# Patient Record
Sex: Male | Born: 1945 | Race: White | Hispanic: No | Marital: Single | Smoking: Current every day smoker
Health system: Southern US, Community
[De-identification: ages and names within clinical notes are randomized; demographics above are authoritative.]

## PROBLEM LIST (undated history)

## (undated) DIAGNOSIS — T8859XA Other complications of anesthesia, initial encounter: Secondary | ICD-10-CM

## (undated) DIAGNOSIS — S060XAA Concussion with loss of consciousness status unknown, initial encounter: Secondary | ICD-10-CM

## (undated) DIAGNOSIS — F32A Depression, unspecified: Secondary | ICD-10-CM

## (undated) DIAGNOSIS — G709 Myoneural disorder, unspecified: Secondary | ICD-10-CM

## (undated) DIAGNOSIS — S060X9A Concussion with loss of consciousness of unspecified duration, initial encounter: Secondary | ICD-10-CM

## (undated) DIAGNOSIS — M1990 Unspecified osteoarthritis, unspecified site: Secondary | ICD-10-CM

## (undated) DIAGNOSIS — F4310 Post-traumatic stress disorder, unspecified: Secondary | ICD-10-CM

## (undated) DIAGNOSIS — I4892 Unspecified atrial flutter: Secondary | ICD-10-CM

## (undated) DIAGNOSIS — E119 Type 2 diabetes mellitus without complications: Secondary | ICD-10-CM

## (undated) DIAGNOSIS — T4145XA Adverse effect of unspecified anesthetic, initial encounter: Secondary | ICD-10-CM

## (undated) DIAGNOSIS — I739 Peripheral vascular disease, unspecified: Secondary | ICD-10-CM

## (undated) DIAGNOSIS — K219 Gastro-esophageal reflux disease without esophagitis: Secondary | ICD-10-CM

## (undated) DIAGNOSIS — I10 Essential (primary) hypertension: Secondary | ICD-10-CM

## (undated) DIAGNOSIS — E785 Hyperlipidemia, unspecified: Secondary | ICD-10-CM

## (undated) DIAGNOSIS — F329 Major depressive disorder, single episode, unspecified: Secondary | ICD-10-CM

## (undated) HISTORY — DX: Unspecified atrial flutter: I48.92

## (undated) HISTORY — DX: Hyperlipidemia, unspecified: E78.5

## (undated) HISTORY — PX: APPENDECTOMY: SHX54

## (undated) HISTORY — PX: EYE SURGERY: SHX253

---

## 1999-03-08 HISTORY — PX: REPLACEMENT TOTAL KNEE: SUR1224

## 2002-04-06 ENCOUNTER — Inpatient Hospital Stay (HOSPITAL_COMMUNITY): Admission: EM | Admit: 2002-04-06 | Discharge: 2002-04-08 | Payer: Self-pay | Admitting: Psychiatry

## 2009-04-21 ENCOUNTER — Ambulatory Visit: Payer: Self-pay | Admitting: Vascular Surgery

## 2010-07-20 NOTE — H&P (Signed)
HISTORY AND PHYSICAL EXAMINATION   April 21, 2009   Re:  Brian Pratt, Brian Pratt                   DOB:  Oct 11, 1945   Patient is a very pleasant 65 year old gentleman with hypertension and  type 2 diabetes mellitus with dyslipidemia.  He is referred to our  office for evaluation of peripheral vascular disease.  At this time, he  states his peripheral vascular disease consists of cramping pain in his  left calf with walking.  He has been experiencing this for a little over  a year.  In 09/01/2008 while in [place], he underwent a CTA which at  that time demonstrated a high-grade stenosis greater than 70% involving  the origin of the left common iliac artery and proximal vessel.  There  was also significant disease involving the anterior tibial and  tibioperoneal trunks with a well-defined single vessel runoff on the  left posterior tibial artery.  On the right, calcified plaque was seen  throughout the right SFA and popliteal artery with 2-vessel runoff.  There was no significant inflow stenosis.  Patient states that his  symptoms have worsened over the past 6 to 9 months.  He is not having  pain at rest; however, he does have discomfort upon minimal-to-moderate  exertion, which is relieved with rest.   PAST MEDICAL HISTORY:  As above, also including COPD and post-traumatic  stress disorder.   PREVIOUS SURGERIES:  Consist of colonoscopy and endoscopy, which were  negative per patient.  Also removal of left Baker's cyst and left knee  replacement.   ALLERGIES:  None.   CURRENT MEDICATIONS:  Hematinic Plus 1 daily p.o.,  lisinopril/hydrochlorothiazide 20/25 one p.o. daily, omeprazole 20 mg 2  p.o. daily, pravastatin 80 mg p.o. daily, men's multivitamin 1 p.o.  daily, vitamin C 1000 mg with bioflavonoids and rose hips p.o. daily,  Januvia 100 mg p.o. daily, metformin 850 mg p.o. t.i.d., Levitra 20 mg  p.o. p.r.n., glipizide 10 mg p.o. b.i.d., omega 3 fish oil 1000 mg  p.o.  daily, Libido-Max p.o. b.i.d., alprazolam 1 mg p.o. q.h.s., OxyContin 40  mg p.o. b.i.d., Combivent inhalers 2 puffs inhalations q.i.d.   FAMILY HISTORY:  He was not aware of any congestive heart failure or  vascular disease in his family.   SOCIAL HISTORY:  He is single with a single course.  He is retired.  He  smokes approximately 8 to 10 cigarettes per day.  He has a remote  history of tobacco use.   REVIEW OF SYSTEMS:  Significant for shortness of breath when lying flat  and with exertion due to his COPD.  He did have GERD significant for  blood in stool.  He does have pain in the legs with walking.  He also  has arthritis, joint and muscle pain.  He is status post-traumatic  stress disorder.  He has had some changes in his eyesight.   PHYSICAL FINDINGS:  A very pleasant, well-nourished man who appeared his  stated age, who was not in distress.  Heart rate was 102, blood pressure  146/75, O2 saturation was 98%.  HEENT: PERRLA.  EOMI.  Normal  conjunctiva.  There was no jugular venous distention or carotid bruits.  Lungs: Clear to auscultation.  Cardiac exam revealed a regular rate and  rhythm.  The abdomen was soft, nontender.  Normal bowel sounds were  present.  There were no masses.  Musculoskeletal exam demonstrated no  major deformities.  There were no focal weaknesses or paresthesias.  The  skin demonstrated no ulcers or rashes.  Vascular exam revealed  peripheral pulses in radials, which were normal.  The right femoral was  normal.  The left femoral was markedly diminished.  The right PT and DP  were palpable.  The left PT and DP were not palpable.   LABORATORY RESULTS:  From [place], he had a carotid ultrasound which  demonstrated no evidence of hemodynamically significant stenosis in  either the common or internal carotid arteries on either side and normal  antegrade vertebral flow bilaterally.  He also had the CTA performed,  which was previously mentioned.  His  reports from [place] mention of a  ABI and Dopplers.  ABI on the right was normal at 1.04.  On the left was  diminished to 0.79.  His ABIs today, 04/21/2009, demonstrated a  continued normal right lower extremity with ABI of 1.01.  Left was 0.61.   At this time, I asked Dr. Hart Pratt for his assistance in evaluating this  patient.  Dr. Hart Pratt entered the room.  Examined the patient's lower  extremities, which included palpation of the arteries of his lower  extremities.  He found his left femoral artery to be markedly  diminished.  We felt this to represent a progression of the disease of  his native vessels.  Dr. Hart Pratt recommended an aortogram with runoff  with possible stent/angioplasty of the left iliac artery to be performed  by Dr. Myra Gianotti on 05/05/2009.  He was informed of the risks and benefits  of the procedure and after careful consideration, elected to proceed.  Arrangements were made for his procedure.   Wilmon Arms, PA   Brian Pratt, M.D.  Electronically Signed   KEL/MEDQ  D:  04/21/2009  T:  04/21/2009  Job:  [id number]
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
## 2010-09-07 DIAGNOSIS — I119 Hypertensive heart disease without heart failure: Secondary | ICD-10-CM | POA: Insufficient documentation

## 2010-09-07 DIAGNOSIS — E119 Type 2 diabetes mellitus without complications: Secondary | ICD-10-CM | POA: Insufficient documentation

## 2011-08-04 DIAGNOSIS — E7800 Pure hypercholesterolemia, unspecified: Secondary | ICD-10-CM | POA: Diagnosis not present

## 2011-08-04 DIAGNOSIS — I10 Essential (primary) hypertension: Secondary | ICD-10-CM | POA: Diagnosis not present

## 2011-08-04 DIAGNOSIS — E119 Type 2 diabetes mellitus without complications: Secondary | ICD-10-CM | POA: Diagnosis not present

## 2011-08-16 DIAGNOSIS — Z79899 Other long term (current) drug therapy: Secondary | ICD-10-CM | POA: Diagnosis not present

## 2011-08-16 DIAGNOSIS — E119 Type 2 diabetes mellitus without complications: Secondary | ICD-10-CM | POA: Diagnosis not present

## 2011-08-16 DIAGNOSIS — Z125 Encounter for screening for malignant neoplasm of prostate: Secondary | ICD-10-CM | POA: Diagnosis not present

## 2011-08-16 DIAGNOSIS — D539 Nutritional anemia, unspecified: Secondary | ICD-10-CM | POA: Diagnosis not present

## 2011-10-26 DIAGNOSIS — I10 Essential (primary) hypertension: Secondary | ICD-10-CM | POA: Diagnosis not present

## 2012-06-25 DIAGNOSIS — E785 Hyperlipidemia, unspecified: Secondary | ICD-10-CM | POA: Diagnosis not present

## 2012-06-25 DIAGNOSIS — R9431 Abnormal electrocardiogram [ECG] [EKG]: Secondary | ICD-10-CM | POA: Diagnosis not present

## 2012-06-25 DIAGNOSIS — I10 Essential (primary) hypertension: Secondary | ICD-10-CM | POA: Diagnosis not present

## 2012-07-06 DIAGNOSIS — F411 Generalized anxiety disorder: Secondary | ICD-10-CM | POA: Diagnosis not present

## 2012-07-06 DIAGNOSIS — G8929 Other chronic pain: Secondary | ICD-10-CM | POA: Diagnosis not present

## 2012-07-06 DIAGNOSIS — N529 Male erectile dysfunction, unspecified: Secondary | ICD-10-CM | POA: Diagnosis not present

## 2012-08-02 DIAGNOSIS — E7800 Pure hypercholesterolemia, unspecified: Secondary | ICD-10-CM | POA: Diagnosis not present

## 2012-08-02 DIAGNOSIS — Z006 Encounter for examination for normal comparison and control in clinical research program: Secondary | ICD-10-CM | POA: Diagnosis not present

## 2012-08-02 DIAGNOSIS — D649 Anemia, unspecified: Secondary | ICD-10-CM | POA: Diagnosis not present

## 2012-08-02 DIAGNOSIS — I10 Essential (primary) hypertension: Secondary | ICD-10-CM | POA: Diagnosis not present

## 2012-08-02 DIAGNOSIS — D539 Nutritional anemia, unspecified: Secondary | ICD-10-CM | POA: Diagnosis not present

## 2012-08-02 DIAGNOSIS — F411 Generalized anxiety disorder: Secondary | ICD-10-CM | POA: Diagnosis not present

## 2012-08-23 DIAGNOSIS — R9431 Abnormal electrocardiogram [ECG] [EKG]: Secondary | ICD-10-CM | POA: Diagnosis not present

## 2012-09-11 DIAGNOSIS — I119 Hypertensive heart disease without heart failure: Secondary | ICD-10-CM | POA: Diagnosis not present

## 2012-12-24 DIAGNOSIS — Z0000 Encounter for general adult medical examination without abnormal findings: Secondary | ICD-10-CM | POA: Diagnosis not present

## 2013-04-08 DIAGNOSIS — IMO0001 Reserved for inherently not codable concepts without codable children: Secondary | ICD-10-CM | POA: Diagnosis not present

## 2013-04-15 DIAGNOSIS — E871 Hypo-osmolality and hyponatremia: Secondary | ICD-10-CM | POA: Diagnosis not present

## 2013-04-16 DIAGNOSIS — IMO0001 Reserved for inherently not codable concepts without codable children: Secondary | ICD-10-CM | POA: Diagnosis not present

## 2013-04-29 DIAGNOSIS — Z79899 Other long term (current) drug therapy: Secondary | ICD-10-CM | POA: Diagnosis not present

## 2013-05-23 DIAGNOSIS — IMO0001 Reserved for inherently not codable concepts without codable children: Secondary | ICD-10-CM | POA: Diagnosis not present

## 2013-12-06 DIAGNOSIS — E1165 Type 2 diabetes mellitus with hyperglycemia: Secondary | ICD-10-CM | POA: Diagnosis not present

## 2014-02-20 DIAGNOSIS — E1165 Type 2 diabetes mellitus with hyperglycemia: Secondary | ICD-10-CM | POA: Diagnosis not present

## 2014-02-20 DIAGNOSIS — D509 Iron deficiency anemia, unspecified: Secondary | ICD-10-CM | POA: Diagnosis not present

## 2014-02-20 DIAGNOSIS — E780 Pure hypercholesterolemia: Secondary | ICD-10-CM | POA: Diagnosis not present

## 2014-02-20 DIAGNOSIS — Z79899 Other long term (current) drug therapy: Secondary | ICD-10-CM | POA: Diagnosis not present

## 2014-02-26 DIAGNOSIS — R5381 Other malaise: Secondary | ICD-10-CM | POA: Diagnosis not present

## 2014-03-05 DIAGNOSIS — R5381 Other malaise: Secondary | ICD-10-CM | POA: Diagnosis not present

## 2014-03-12 DIAGNOSIS — D510 Vitamin B12 deficiency anemia due to intrinsic factor deficiency: Secondary | ICD-10-CM | POA: Diagnosis not present

## 2014-03-19 DIAGNOSIS — D510 Vitamin B12 deficiency anemia due to intrinsic factor deficiency: Secondary | ICD-10-CM | POA: Diagnosis not present

## 2014-04-01 DIAGNOSIS — E1165 Type 2 diabetes mellitus with hyperglycemia: Secondary | ICD-10-CM | POA: Diagnosis not present

## 2014-04-01 DIAGNOSIS — D510 Vitamin B12 deficiency anemia due to intrinsic factor deficiency: Secondary | ICD-10-CM | POA: Diagnosis not present

## 2014-04-22 DIAGNOSIS — D510 Vitamin B12 deficiency anemia due to intrinsic factor deficiency: Secondary | ICD-10-CM | POA: Diagnosis not present

## 2014-04-22 DIAGNOSIS — E1165 Type 2 diabetes mellitus with hyperglycemia: Secondary | ICD-10-CM | POA: Diagnosis not present

## 2014-04-29 DIAGNOSIS — E1165 Type 2 diabetes mellitus with hyperglycemia: Secondary | ICD-10-CM | POA: Diagnosis not present

## 2014-04-29 DIAGNOSIS — Z0000 Encounter for general adult medical examination without abnormal findings: Secondary | ICD-10-CM | POA: Diagnosis not present

## 2014-05-23 DIAGNOSIS — D510 Vitamin B12 deficiency anemia due to intrinsic factor deficiency: Secondary | ICD-10-CM | POA: Diagnosis not present

## 2014-05-28 DIAGNOSIS — I499 Cardiac arrhythmia, unspecified: Secondary | ICD-10-CM | POA: Diagnosis not present

## 2014-05-28 DIAGNOSIS — E1165 Type 2 diabetes mellitus with hyperglycemia: Secondary | ICD-10-CM | POA: Diagnosis not present

## 2014-05-28 DIAGNOSIS — Z1389 Encounter for screening for other disorder: Secondary | ICD-10-CM | POA: Diagnosis not present

## 2015-11-11 ENCOUNTER — Other Ambulatory Visit: Payer: Self-pay | Admitting: Vascular Surgery

## 2015-11-11 ENCOUNTER — Encounter: Payer: Self-pay | Admitting: Vascular Surgery

## 2015-11-11 DIAGNOSIS — I739 Peripheral vascular disease, unspecified: Secondary | ICD-10-CM

## 2015-11-12 ENCOUNTER — Other Ambulatory Visit: Payer: Self-pay

## 2015-11-12 ENCOUNTER — Ambulatory Visit (HOSPITAL_COMMUNITY)
Admission: RE | Admit: 2015-11-12 | Discharge: 2015-11-12 | Disposition: A | Discharge status: Home / Self Care | Payer: Non-veteran care | Source: Ambulatory Visit | Attending: Vascular Surgery | Admitting: Vascular Surgery

## 2015-11-12 ENCOUNTER — Encounter: Payer: Self-pay | Admitting: Vascular Surgery

## 2015-11-12 ENCOUNTER — Ambulatory Visit (INDEPENDENT_AMBULATORY_CARE_PROVIDER_SITE_OTHER): Payer: Non-veteran care | Admitting: Vascular Surgery

## 2015-11-12 VITALS — BP 149/74 | HR 77 | Temp 96.8°F | Resp 18 | Ht 72.0 in | Wt 184.0 lb

## 2015-11-12 DIAGNOSIS — I739 Peripheral vascular disease, unspecified: Secondary | ICD-10-CM | POA: Diagnosis not present

## 2015-11-12 DIAGNOSIS — R0989 Other specified symptoms and signs involving the circulatory and respiratory systems: Secondary | ICD-10-CM | POA: Diagnosis present

## 2015-11-12 LAB — VAS US LOWER EXTREMITY ARTERIAL DUPLEX
LPERODISTSYS: 11 cm/s
LPTIBDISTSYS: 23 cm/s
LSFMPSV: -26 cm/s
Left popliteal dist sys PSV: -18 cm/s
Left popliteal prox sys PSV: 25 cm/s
Left super femoral dist sys PSV: -41 cm/s
Left super femoral prox sys PSV: -15 cm/s
RATIBDISTSYS: 26 cm/s
RPEROMIDSYS: 15 cm/s
RPOPPPSV: 49 cm/s
RSFDPSV: -38 cm/s
RTIBMIDSYS: 18 cm/s
Right ant tibeal sys PSV: 23 cm/s
Right popliteal dist sys PSV: -32 cm/s
Right super femoral mid sys PSV: -58 cm/s
Right super femoral prox sys PSV: 430 cm/s

## 2015-11-12 NOTE — Progress Notes (Signed)
Vitals:   11/12/15 1110  BP: (!) 153/72  Pulse: 77  Resp: 18  Temp: (!) 96.8 F (36 C)  SpO2: 100%  Weight: 184 lb (83.5 kg)  Height: 6' (1.829 m)

## 2015-11-12 NOTE — Progress Notes (Signed)
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 male, and today for evaluation of bilateral lower extremity arterial insufficiency. He is a very pleasant gentleman has been followed at the [place] in [facility]. He reports striking the lateral aspect of his foot approximate 4 months ago with superficial abrasion. This is still not healed. He reports pain in his foot that is 24 hours a day. Does have very clear-cut classic calf claudication bilaterally. This is somewhat limiting to him. 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 Denies any prior history of cardiac disease. Denies any prior history of stroke. Has been on Neurontin for the pain in his left foot with no improvement. Has been taken over-the-counter pain medications with no response.  No past medical history on file.  No family history on file.  SOCIAL HISTORY: Social History   Social History  . Marital status: Single    Spouse name: N/A  . Number of children: N/A  . Years of education: N/A   Occupational History  . Not on file.   Social History Main Topics  . Smoking status: Current Every Day Smoker    Years: 40.00    Types: Cigarettes  . Smokeless tobacco: Never Used  . Alcohol use No  . Drug use: No  . Sexual activity: Not on file   Other Topics Concern  . Not on file   Social History Narrative  . No narrative on file    No Known Allergies  Current Outpatient Prescriptions  Medication Sig Dispense Refill  . atorvastatin (LIPITOR) 80 MG tablet Take 80 mg by mouth daily.    Marland Kitchen gabapentin (NEURONTIN) 300 MG capsule Take 300 mg by mouth at bedtime.    Marland Kitchen glipiZIDE (GLUCOTROL) 10 MG tablet Take 10 mg by mouth 2 (two) times daily before a  meal.    . lisinopril-hydrochlorothiazide (PRINZIDE,ZESTORETIC) 20-25 MG tablet Take 1 tablet by mouth daily.    . metFORMIN (GLUCOPHAGE) 1000 MG tablet Take 1,000 mg by mouth 2 (two) times daily with a meal.    . omeprazole (PRILOSEC) 20 MG capsule Take 20 mg by mouth daily.    . polyvinyl alcohol (LIQUIFILM TEARS) 1.4 % ophthalmic solution Place 1 drop into both eyes 4 (four) times daily.    . sildenafil (VIAGRA) 100 MG tablet Take 100 mg by mouth daily as needed for erectile dysfunction.     No current facility-administered medications for this visit.     REVIEW OF SYSTEMS:  [X]  denotes positive finding, [ ]  denotes negative finding Cardiac  Comments:  Chest pain or chest pressure:    Shortness of breath upon exertion: x   Short of breath when lying flat:    Irregular heart rhythm:        Vascular    Pain in calf, thigh, or hip brought on by ambulation: x   Pain in feet at night that wakes you up from your sleep:  x   Blood clot in your veins:    Leg swelling:         Pulmonary    Oxygen at home:    Productive cough:     Wheezing:  Neurologic    Sudden weakness in arms or legs:  xx   Sudden numbness in arms or legs:  x   Sudden onset of difficulty speaking or slurred speech:    Temporary loss of vision in one eye:     Problems with dizziness:         Gastrointestinal    Blood in stool:     Vomited blood:         Genitourinary    Burning when urinating:     Blood in urine:        Psychiatric    Major depression:         Hematologic    Bleeding problems:    Problems with blood clotting too easily:        Skin    Rashes or ulcers:        Constitutional    Fever or chills:      PHYSICAL EXAM: Vitals:   11/12/15 1110 11/12/15 1116  BP: (!) 153/72 (!) 149/74  Pulse: 77 77  Resp: 18   Temp: (!) 96.8 F (36 C)   SpO2: 100%   Weight: 184 lb (83.5 kg)   Height: 6' (1.829 m)     GENERAL: The patient is a well-nourished male, in no acute distress.  The vital signs are documented above. CARDIAC: There is a regular rate and rhythm.  VASCULAR: Carotid arteries without bruits bilaterally. 2+ radial pulses bilaterally. 1+ femoral on the right and absent femoral pulse on the left. No palpable popliteal or distal pulses PULMONARY: There is good air exchange bilaterally without wheezing or rales. ABDOMEN: Soft and non-tender with normal pitched bowel sounds. He has no abdominal bruit MUSCULOSKELETAL: There are no major deformities or cyanosis. NEUROLOGIC: No focal weakness or paresthesias are detected. SKIN: Does have dependent rubor in his left foot versus his right. Does have an area over his lateral heel and lateral dorsal foot with healing open wound. PSYCHIATRIC: The patient has a normal affect.  DATA:   Noninvasive studies revealed on a phasic flow in his common femoral arteries bilaterally suggesting inflow occlusive disease he has a evidence of superficial femoral and tibial disease bilaterally as well  MEDICAL ISSUES:  Had long discussion with patient and family members present. Explained that the certainly his calf claudication is related to his arterial insufficiency. Also suspect that his at least component of his left foot pain is related to arterial insufficiency. He has classic dependent rubor associated with this. He has had wounds on his foot for 4-5 months but these have been slowly healing. I have recommended arteriogram for further evaluation to determine level of occlusive disease and appropriate treatment. Explained that he may be a candidate for endovascular treatment with balloon angioplasty and stenting. Also explained that his occlusive disease may be to severe and may require surgical bypass. Will make this determination following outpatient arteriogram which we'll schedule his earliest convenience   Aiyden Lauderback [facility] [employer] [phone]

## 2015-11-16 ENCOUNTER — Ambulatory Visit (HOSPITAL_COMMUNITY)
Admission: RE | Admit: 2015-11-16 | Discharge: 2015-11-16 | Disposition: A | Discharge status: Home / Self Care | Payer: Non-veteran care | Source: Ambulatory Visit | Attending: Vascular Surgery | Admitting: Vascular Surgery

## 2015-11-16 ENCOUNTER — Encounter (HOSPITAL_COMMUNITY)
Admission: RE | Disposition: A | Discharge status: Home / Self Care | Payer: Self-pay | Source: Ambulatory Visit | Attending: Vascular Surgery

## 2015-11-16 ENCOUNTER — Encounter (HOSPITAL_COMMUNITY): Payer: Self-pay | Admitting: Vascular Surgery

## 2015-11-16 ENCOUNTER — Other Ambulatory Visit: Payer: Self-pay | Admitting: *Deleted

## 2015-11-16 DIAGNOSIS — I70245 Atherosclerosis of native arteries of left leg with ulceration of other part of foot: Secondary | ICD-10-CM | POA: Insufficient documentation

## 2015-11-16 DIAGNOSIS — I70211 Atherosclerosis of native arteries of extremities with intermittent claudication, right leg: Secondary | ICD-10-CM | POA: Insufficient documentation

## 2015-11-16 DIAGNOSIS — I700 Atherosclerosis of aorta: Secondary | ICD-10-CM | POA: Diagnosis not present

## 2015-11-16 DIAGNOSIS — F17210 Nicotine dependence, cigarettes, uncomplicated: Secondary | ICD-10-CM | POA: Diagnosis not present

## 2015-11-16 DIAGNOSIS — Z7984 Long term (current) use of oral hypoglycemic drugs: Secondary | ICD-10-CM | POA: Diagnosis not present

## 2015-11-16 DIAGNOSIS — I70213 Atherosclerosis of native arteries of extremities with intermittent claudication, bilateral legs: Secondary | ICD-10-CM | POA: Diagnosis not present

## 2015-11-16 DIAGNOSIS — I739 Peripheral vascular disease, unspecified: Secondary | ICD-10-CM

## 2015-11-16 DIAGNOSIS — L97529 Non-pressure chronic ulcer of other part of left foot with unspecified severity: Secondary | ICD-10-CM | POA: Insufficient documentation

## 2015-11-16 HISTORY — PX: PERIPHERAL VASCULAR CATHETERIZATION: SHX172C

## 2015-11-16 LAB — POCT I-STAT, CHEM 8
BUN: 15 mg/dL (ref 6–20)
CALCIUM ION: 1.18 mmol/L (ref 1.15–1.40)
CREATININE: 1.1 mg/dL (ref 0.61–1.24)
Chloride: 94 mmol/L — ABNORMAL LOW (ref 101–111)
Glucose, Bld: 212 mg/dL — ABNORMAL HIGH (ref 65–99)
HEMATOCRIT: 43 % (ref 39.0–52.0)
Hemoglobin: 14.6 g/dL (ref 13.0–17.0)
POTASSIUM: 4.6 mmol/L (ref 3.5–5.1)
SODIUM: 134 mmol/L — AB (ref 135–145)
TCO2: 25 mmol/L (ref 0–100)

## 2015-11-16 SURGERY — ABDOMINAL AORTOGRAM W/LOWER EXTREMITY
Anesthesia: LOCAL

## 2015-11-16 MED ORDER — SODIUM CHLORIDE 0.9 % IV SOLN
INTRAVENOUS | Status: DC
  Administered 2015-11-16: 07:00:00 via INTRAVENOUS

## 2015-11-16 MED ORDER — HEPARIN (PORCINE) IN NACL 2-0.9 UNIT/ML-% IJ SOLN
INTRAMUSCULAR | Status: DC | PRN
  Administered 2015-11-16: 1000 mL via INTRA_ARTERIAL

## 2015-11-16 MED ORDER — HEPARIN (PORCINE) IN NACL 2-0.9 UNIT/ML-% IJ SOLN
INTRAMUSCULAR | Status: AC
  Filled 2015-11-16: qty 1000

## 2015-11-16 MED ORDER — FENTANYL CITRATE (PF) 100 MCG/2ML IJ SOLN
INTRAMUSCULAR | Status: DC | PRN
  Administered 2015-11-16: 50 ug via INTRAVENOUS

## 2015-11-16 MED ORDER — MIDAZOLAM HCL 2 MG/2ML IJ SOLN
INTRAMUSCULAR | Status: DC | PRN
  Administered 2015-11-16: 1 mg via INTRAVENOUS

## 2015-11-16 MED ORDER — FENTANYL CITRATE (PF) 100 MCG/2ML IJ SOLN
INTRAMUSCULAR | Status: AC
  Filled 2015-11-16: qty 2

## 2015-11-16 MED ORDER — SODIUM CHLORIDE 0.9 % IV SOLN: 1.0000 mL/kg/h | INTRAVENOUS | Status: DC

## 2015-11-16 MED ORDER — LIDOCAINE HCL (PF) 1 % IJ SOLN
INTRAMUSCULAR | Status: DC | PRN
  Administered 2015-11-16: 10 mL via SUBCUTANEOUS

## 2015-11-16 MED ORDER — MIDAZOLAM HCL 2 MG/2ML IJ SOLN
INTRAMUSCULAR | Status: AC
  Filled 2015-11-16: qty 2

## 2015-11-16 MED ORDER — LIDOCAINE HCL (PF) 1 % IJ SOLN
INTRAMUSCULAR | Status: AC
  Filled 2015-11-16: qty 30

## 2015-11-16 MED ORDER — HYDRALAZINE HCL 20 MG/ML IJ SOLN: 10.0000 mg | INTRAMUSCULAR | Status: DC | PRN

## 2015-11-16 MED ORDER — IODIXANOL 320 MG/ML IV SOLN
INTRAVENOUS | Status: DC | PRN
  Administered 2015-11-16: 167 mL via INTRA_ARTERIAL

## 2015-11-16 SURGICAL SUPPLY — 14 items
CATH ANGIO 5F BER2 65CM (CATHETERS) ×2 IMPLANT
CATH ANGIO 5F PIGTAIL 65CM (CATHETERS) ×2 IMPLANT
CATH STRAIGHT 5FR 65CM (CATHETERS) ×2 IMPLANT
COVER PRB 48X5XTLSCP FOLD TPE (BAG) ×1 IMPLANT
COVER PROBE 5X48 (BAG) ×1
DEVICE TORQUE .025-.038 (MISCELLANEOUS) ×2 IMPLANT
GUIDEWIRE ANGLED .035X150CM (WIRE) ×2 IMPLANT
KIT MICROINTRODUCER STIFF 5F (SHEATH) ×2 IMPLANT
KIT PV (KITS) ×2 IMPLANT
SHEATH PINNACLE 5F 10CM (SHEATH) ×2 IMPLANT
SYR MEDRAD MARK V 150ML (SYRINGE) ×2 IMPLANT
TRANSDUCER W/STOPCOCK (MISCELLANEOUS) ×2 IMPLANT
TRAY PV CATH (CUSTOM PROCEDURE TRAY) ×2 IMPLANT
WIRE BENTSON .035X145CM (WIRE) ×2 IMPLANT

## 2015-11-16 NOTE — Op Note (Signed)
   PATIENT: Brian Pratt   MRN: [id number] DOB: Jun 24, 1945    DATE OF PROCEDURE: 11/16/2015  INDICATIONS: Brian Pratt is a 70 y.o. male with bilateral lower extremity claudication. He presents for arteriography and possible intervention.  PROCEDURE:  1. Ultrasound-guided access to the right common femoral artery 2. Aortogram with bilateral iliac arteriogram and bilateral lower extremity runoff.  SURGEON: Judeth Cornfield. Scot Dock, MD, FACS  ANESTHESIA: local with sedation   EBL: minimal  TECHNIQUE: The patient was taken to the peripheral vascular lab.. The patient was sedated with 1 mg of Versed and 50 g of fentanyl.  The total time of sedation was 44 minutes. The patient's blood pressure and vital signs were monitored during this time by myself and the staff. Both groins are prepped and draped in usual sterile fashion. Under all shall guidance, after the skin was anesthetized, the right common femoral artery was cannulated with micropuncture needle and a micropuncture sheath introduced over a wire. In order to get past a calcific plaque in the right external iliac artery I used a Berenstein catheter and an angled Glidewire. I was able to get this wire up to the aorta. Flush aortogram was obtained. I then advanced the catheter into the lateral projection. Next the catheter was pulled down above the bifurcation and oblique iliac projections were obtained. Next bilateral lower extremity runoff films were obtained. At the completion of the procedure the wire  And catheter were removed. The pressure was removed and pressure held for hemostasis. No immediate complications were noted.  FINDINGS:  1. There are multiple assess her renal arteries bilaterally. The infrarenal aorta is patent with a eccentric calcific plaque in the distal aorta. 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 The superior mesenteric artery is widely patent. 2. On the right side, the common iliac artery is patent. There is a calcific  plaque in the proximal external iliac artery. There is diffuse disease in the hypogastric artery on the right. The common femoral artery is essentially occluded. There is reconstitution of the deep femoral artery and superficial femoral artery. There is mild diffuse disease of the superficial femoral artery. The right anterior tibial artery is occluded. There is two-vessel runoff on the right via the peroneal and posterior tibial arteries. 3. On the left side, there is diffuse disease of the left common iliac artery and external iliac artery.  The hypogastric artery is diffusely diseased. The left superficial femoral artery has diffuse disease. There is diffuse disease of the anterior tibial on the left with 2 vessel runoff via the posterior tibial and peroneal arteries on the left.  CLINICAL NOTE: The patient will be scheduled for a follow up visit in 3 months with Dr. Donnetta Hutching.   Deitra Mayo, MD, FACS [facility]  DATE OF DICTATION:   11/16/2015

## 2015-11-16 NOTE — H&P (View-Only) (Signed)
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 male, and today for evaluation of bilateral lower extremity arterial insufficiency. He is a very pleasant gentleman has been followed at the [place] in [facility]. He reports striking the lateral aspect of his foot approximate 4 months ago with superficial abrasion. This is still not healed. He reports pain in his foot that is 24 hours a day. Does have very clear-cut classic calf claudication bilaterally. This is somewhat limiting to him. 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 Denies any prior history of cardiac disease. Denies any prior history of stroke. Has been on Neurontin for the pain in his left foot with no improvement. Has been taken over-the-counter pain medications with no response.  No past medical history on file.  No family history on file.  SOCIAL HISTORY: Social History   Social History  . Marital status: Single    Spouse name: N/A  . Number of children: N/A  . Years of education: N/A   Occupational History  . Not on file.   Social History Main Topics  . Smoking status: Current Every Day Smoker    Years: 40.00    Types: Cigarettes  . Smokeless tobacco: Never Used  . Alcohol use No  . Drug use: No  . Sexual activity: Not on file   Other Topics Concern  . Not on file   Social History Narrative  . No narrative on file    No Known Allergies  Current Outpatient Prescriptions  Medication Sig Dispense Refill  . atorvastatin (LIPITOR) 80 MG tablet Take 80 mg by mouth daily.    Marland Kitchen gabapentin (NEURONTIN) 300 MG capsule Take 300 mg by mouth at bedtime.    Marland Kitchen glipiZIDE (GLUCOTROL) 10 MG tablet Take 10 mg by mouth 2 (two) times daily before a  meal.    . lisinopril-hydrochlorothiazide (PRINZIDE,ZESTORETIC) 20-25 MG tablet Take 1 tablet by mouth daily.    . metFORMIN (GLUCOPHAGE) 1000 MG tablet Take 1,000 mg by mouth 2 (two) times daily with a meal.    . omeprazole (PRILOSEC) 20 MG capsule Take 20 mg by mouth daily.    . polyvinyl alcohol (LIQUIFILM TEARS) 1.4 % ophthalmic solution Place 1 drop into both eyes 4 (four) times daily.    . sildenafil (VIAGRA) 100 MG tablet Take 100 mg by mouth daily as needed for erectile dysfunction.     No current facility-administered medications for this visit.     REVIEW OF SYSTEMS:  [X]  denotes positive finding, [ ]  denotes negative finding Cardiac  Comments:  Chest pain or chest pressure:    Shortness of breath upon exertion: x   Short of breath when lying flat:    Irregular heart rhythm:        Vascular    Pain in calf, thigh, or hip brought on by ambulation: x   Pain in feet at night that wakes you up from your sleep:  x   Blood clot in your veins:    Leg swelling:         Pulmonary    Oxygen at home:    Productive cough:     Wheezing:  Neurologic    Sudden weakness in arms or legs:  xx   Sudden numbness in arms or legs:  x   Sudden onset of difficulty speaking or slurred speech:    Temporary loss of vision in one eye:     Problems with dizziness:         Gastrointestinal    Blood in stool:     Vomited blood:         Genitourinary    Burning when urinating:     Blood in urine:        Psychiatric    Major depression:         Hematologic    Bleeding problems:    Problems with blood clotting too easily:        Skin    Rashes or ulcers:        Constitutional    Fever or chills:      PHYSICAL EXAM: Vitals:   11/12/15 1110 11/12/15 1116  BP: (!) 153/72 (!) 149/74  Pulse: 77 77  Resp: 18   Temp: (!) 96.8 F (36 C)   SpO2: 100%   Weight: 184 lb (83.5 kg)   Height: 6' (1.829 m)     GENERAL: The patient is a well-nourished male, in no acute distress.  The vital signs are documented above. CARDIAC: There is a regular rate and rhythm.  VASCULAR: Carotid arteries without bruits bilaterally. 2+ radial pulses bilaterally. 1+ femoral on the right and absent femoral pulse on the left. No palpable popliteal or distal pulses PULMONARY: There is good air exchange bilaterally without wheezing or rales. ABDOMEN: Soft and non-tender with normal pitched bowel sounds. He has no abdominal bruit MUSCULOSKELETAL: There are no major deformities or cyanosis. NEUROLOGIC: No focal weakness or paresthesias are detected. SKIN: Does have dependent rubor in his left foot versus his right. Does have an area over his lateral heel and lateral dorsal foot with healing open wound. PSYCHIATRIC: The patient has a normal affect.  DATA:   Noninvasive studies revealed on a phasic flow in his common femoral arteries bilaterally suggesting inflow occlusive disease he has a evidence of superficial femoral and tibial disease bilaterally as well  MEDICAL ISSUES:  Had long discussion with patient and family members present. Explained that the certainly his calf claudication is related to his arterial insufficiency. Also suspect that his at least component of his left foot pain is related to arterial insufficiency. He has classic dependent rubor associated with this. He has had wounds on his foot for 4-5 months but these have been slowly healing. I have recommended arteriogram for further evaluation to determine level of occlusive disease and appropriate treatment. Explained that he may be a candidate for endovascular treatment with balloon angioplasty and stenting. Also explained that his occlusive disease may be to severe and may require surgical bypass. Will make this determination following outpatient arteriogram which we'll schedule his earliest convenience   Domanick Cuccia [facility] [employer] [phone]

## 2015-11-16 NOTE — Progress Notes (Signed)
Prior to ambulation

## 2015-11-16 NOTE — Interval H&P Note (Signed)
History and Physical Interval Note:  11/16/2015 8:12 AM  Brian Pratt  has presented today for surgery, with the diagnosis of pvd woth left foot ulcer  The various methods of treatment have been discussed with the patient and family. After consideration of risks, benefits and other options for treatment, the patient has consented to  Procedure(s): Abdominal Aortogram w/Lower Extremity (N/A) as a surgical intervention .  The patient's history has been reviewed, patient examined, no change in status, stable for surgery.  I have reviewed the patient's chart and labs.  Questions were answered to the patient's satisfaction.     Deitra Mayo

## 2015-11-16 NOTE — Discharge Instructions (Signed)
Angiogram, Care After Refer to this sheet in the next few weeks. These instructions provide you with information about caring for yourself after your procedure. Your health care provider may also give you more specific instructions. Your treatment has been planned according to current medical practices, but problems sometimes occur. Call your health care provider if you have any problems or questions after your procedure. WHAT TO EXPECT AFTER THE PROCEDURE After your procedure, it is typical to have the following:  Bruising at the catheter insertion site that usually fades within 1-2 weeks.  Blood collecting in the tissue (hematoma) that may be painful to the touch. It should usually decrease in size and tenderness within 1-2 weeks. HOME CARE INSTRUCTIONS  Take medicines only as directed by your health care provider.  You may shower 24-48 hours after the procedure or as directed by your health care provider. Remove the bandage (dressing) and gently wash the site with plain soap and water. Pat the area dry with a clean towel. Do not rub the site, because this may cause bleeding.  Do not take baths, swim, or use a hot tub until your health care provider approves.  Check your insertion site every day for redness, swelling, or drainage.  Do not apply powder or lotion to the site.  Do not lift over 10 lb (4.5 kg) for 5 days after your procedure or as directed by your health care provider.  Ask your health care provider when it is okay to:  Return to work or school.  Resume usual physical activities or sports.  Resume sexual activity.  Do not drive home if you are discharged the same day as the procedure. Have someone else drive you.  You may drive 24 hours after the procedure unless otherwise instructed by your health care provider.  Do not operate machinery or power tools for 24 hours after the procedure or as directed by your health care provider.  If your procedure was done as an  outpatient procedure, which means that you went home the same day as your procedure, a responsible adult should be with you for the first 24 hours after you arrive home.  Keep all follow-up visits as directed by your health care provider. This is important. SEEK MEDICAL CARE IF:  You have a fever.  You have chills.  You have increased bleeding from the catheter insertion site. Hold pressure on the site. SEEK IMMEDIATE MEDICAL CARE IF:  You have unusual pain at the catheter insertion site.  You have redness, warmth, or swelling at the catheter insertion site.  You have drainage (other than a small amount of blood on the dressing) from the catheter insertion site.  The catheter insertion site is bleeding, and the bleeding does not stop after 30 minutes of holding steady pressure on the site.  The area near or just beyond the catheter insertion site becomes pale, cool, tingly, or numb.   This information is not intended to replace advice given to you by your health care provider. Make sure you discuss any questions you have with your health care provider.   Document Released: 09/09/2004 Document Revised: 03/14/2014 Document Reviewed: 07/25/2012 Elsevier Interactive Patient Education 2016 [place] metformin - restart on Thursday.

## 2015-11-16 NOTE — Progress Notes (Signed)
After ambulation. Pt denied increased pain, dizziness or shortness of breath during ambulation. Pt tolerated well.

## 2015-11-20 ENCOUNTER — Telehealth: Payer: Self-pay

## 2015-11-20 NOTE — Telephone Encounter (Signed)
Phone call from pt.  Stated he has a "black and blue discoloration around his right testicle and to the tip of the penis.  Stated there is a "dark purple color in the right groin".  Denied any swelling in the groin or genital region.  Stated the right  groin is sore, but not really painful.  Reassured the pt. That the bruising is expected after the angiogram, due to the amt. Of direct pressure that is applied after the procedure, to control any bleeding.  Advised to continue to monitor for any swelling or increased pain @ site.  Advised the bruising will take time to be reabsorbed.  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 Twice /day.  Reported he takes ES Excedrin about 6-7 times/ day for the pain in his feet, because the Gabapentin isn't controlling the pain.  Advised to try ES Tylenol, instead of the Excedrin.  The pt. Stated he has tried Tylenol and Advil, but neither of them help.  Reported he left a message at the [place] [facility] in [place] 2 days ago re: the pain, but hasn't rec'd a call back yet.  The pt. Requested to have his records of the recent office evaluation and procedure sent to the [place] in [place], to the attention of Dr. Toribio Harbour [place]/ [facility].  Also questioned if he has to wait 3 mos. To f/u and discuss the next step in the treatment.  Advised will discuss with Dr. Donnetta Hutching, and call pt. Next week with recommendation.  Verb. Understanding.

## 2015-11-25 ENCOUNTER — Telehealth: Payer: Self-pay | Admitting: Vascular Surgery

## 2015-11-25 NOTE — Telephone Encounter (Signed)
Sched appt 02/23/16 lab at 26 and MD at 1. Pt thought he could request having a bypass before 3 m. Told pt that TFE's last office note said he would make the determination to do the surgery or wait 3 m after the artierogram and he ordered a 3 m f/u. Pt still wanted to speak to the sching nurse.

## 2015-11-25 NOTE — Telephone Encounter (Signed)
-----   Message from Mena Goes, RN sent at 11/16/2015 11:11 AM EDT ----- Regarding: schedule 3 months w/ ABIs    ----- Message ----- From: Angelia Mould, MD Sent: 11/16/2015   9:51 AM To: [facility] Charge Pool Subject: charge                                         PROCEDURE:  1. Ultrasound-guided access to the right common femoral artery 2. Aortogram with bilateral iliac arteriogram and bilateral lower extremity runoff.  SURGEON: Judeth Cornfield. Scot Dock, MD, FACS  He needs to follow up with Dr. Donnetta Hutching in 3 months. Thank you. He should have ABIs at that time. CD

## 2015-12-17 ENCOUNTER — Encounter: Payer: Self-pay | Admitting: Vascular Surgery

## 2015-12-23 ENCOUNTER — Ambulatory Visit (INDEPENDENT_AMBULATORY_CARE_PROVIDER_SITE_OTHER): Payer: No Typology Code available for payment source | Admitting: Vascular Surgery

## 2015-12-23 VITALS — BP 120/75 | HR 77 | Temp 96.8°F | Resp 18 | Ht 72.0 in | Wt 201.0 lb

## 2015-12-23 DIAGNOSIS — I739 Peripheral vascular disease, unspecified: Secondary | ICD-10-CM

## 2015-12-23 NOTE — Progress Notes (Signed)
Vascular and Vein Specialist of [place]  Patient name: Brian Pratt MRN: [id number] DOB: 04-Sep-1945 Sex: male  REASON FOR VISIT: All up lower extremity ischemia  HPI: Brian Pratt is a 70 y.o. male today for follow-up. He underwent arteriography on 11/16/2015. 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 His superficial arteries are patent bilaterally but have diffuse disease bilaterally. He does have relatively normal runoff bilaterally. He continues to have pain that. 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 He has very clear-cut thigh claudication but this is not as limiting as the pain in his left foot. The pain in his left foot can occur at night also occurs throughout the day. He did have a ulceration on the dorsum of his foot which is completely healed on the left. He also reports new symptom of what sounds like retrograde ejaculation. Reports this is been present for approximately 8 months. Explain the very unlikely have any association with his arterial flow and that he may want to discuss this with the urologist at the [facility].  No past medical history on file.  No family history on file.  SOCIAL HISTORY: Social History  Substance Use Topics  . Smoking status: Current Every Day Smoker    Years: 40.00    Types: Cigarettes  . Smokeless tobacco: Never Used  . Alcohol use No    No Known Allergies  Current Outpatient Prescriptions  Medication Sig Dispense Refill  . albuterol-ipratropium (COMBIVENT) 18-103 MCG/ACT inhaler Inhale 2 puffs into the lungs 2 (two) times daily.    . Aspirin-Acetaminophen-Caffeine (EXCEDRIN PO) Take 1 tablet by mouth as needed (PAIN).    Marland Kitchen atorvastatin (LIPITOR) 80 MG tablet Take 80 mg by mouth daily.    Marland Kitchen gabapentin (NEURONTIN) 300 MG capsule Take 300 mg by mouth 2 (two) times daily.     Marland Kitchen glipiZIDE (GLUCOTROL) 10 MG tablet Take 10  mg by mouth 2 (two) times daily before a meal.    . lisinopril-hydrochlorothiazide (PRINZIDE,ZESTORETIC) 20-25 MG tablet Take 1 tablet by mouth daily.    . metFORMIN (GLUCOPHAGE) 1000 MG tablet Take 1,000 mg by mouth 2 (two) times daily with a meal.    . omeprazole (PRILOSEC) 20 MG capsule Take 20 mg by mouth daily.    . polyvinyl alcohol (LIQUIFILM TEARS) 1.4 % ophthalmic solution Place 1 drop into both eyes as needed for dry eyes.     . sildenafil (VIAGRA) 100 MG tablet Take 100 mg by mouth daily as needed for erectile dysfunction.     No current facility-administered medications for this visit.     REVIEW OF SYSTEMS:  [X]  denotes positive finding, [ ]  denotes negative finding Cardiac  Comments:  Chest pain or chest pressure:    Shortness of breath upon exertion:    Short of breath when lying flat:    Irregular heart rhythm:        Vascular    Pain in calf, thigh, or hip brought on by ambulation: x   Pain in feet at night that wakes you up from your sleep:  x   Blood clot in your veins:    Leg swelling:           PHYSICAL EXAM: Vitals:   12/23/15 1114  BP: 120/75  Pulse: 77  Resp: 18  Temp: (!) 96.8 F (36 C)  SpO2: 98%  Weight: 201 lb (91.2 kg)  Height: 6' (1.829  m)    GENERAL: The patient is a well-nourished male, in no acute distress. The vital signs are documented above. CARDIOVASCULAR: No palpable popliteal or distal pulses. PULMONARY: There is good air exchange  MUSCULOSKELETAL: There are no major deformities or cyanosis. NEUROLOGIC: No focal weakness or paresthesias are detected. 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 The ulceration over the dorsum of his foot is completely healed PSYCHIATRIC: The patient has a normal affect.  DATA:  I did review his arteriogram with patient and his family present. He would be an out excellent candidate for aortobifemoral bypass grafting. Also would be a candidate for right common femoral endarterectomy and  right to left femorofemoral bypass. Explained the advantages and limitations of both of these procedures to the patient and his family.  MEDICAL ISSUES: He is currently having no progression of tissue loss and has pain which may be related to arterial insufficiency or neuropathy. He wishes to continue observation only at this time. Heand notify us immediately should he develop any new ulcerations or tissue loss. Otherwise we'll see him again in 2 months for continued discussion    Rosetta Posner, MD [facility] Vascular and Vein Specialists of [facility] Tel [phone] Pager [phone]

## 2016-01-11 ENCOUNTER — Other Ambulatory Visit: Payer: Self-pay

## 2016-01-15 ENCOUNTER — Encounter: Payer: Self-pay | Admitting: *Deleted

## 2016-01-22 ENCOUNTER — Encounter: Payer: Self-pay | Admitting: *Deleted

## 2016-02-02 ENCOUNTER — Encounter (HOSPITAL_COMMUNITY)
Admission: RE | Admit: 2016-02-02 | Discharge: 2016-02-02 | Disposition: A | Discharge status: Home / Self Care | Payer: Non-veteran care | Source: Ambulatory Visit | Attending: Vascular Surgery | Admitting: Vascular Surgery

## 2016-02-02 ENCOUNTER — Encounter (HOSPITAL_COMMUNITY): Payer: Self-pay

## 2016-02-02 HISTORY — DX: Concussion with loss of consciousness of unspecified duration, initial encounter: S06.0X9A

## 2016-02-02 HISTORY — DX: Myoneural disorder, unspecified: G70.9

## 2016-02-02 HISTORY — DX: Peripheral vascular disease, unspecified: I73.9

## 2016-02-02 HISTORY — DX: Post-traumatic stress disorder, unspecified: F43.10

## 2016-02-02 HISTORY — DX: Essential (primary) hypertension: I10

## 2016-02-02 HISTORY — DX: Type 2 diabetes mellitus without complications: E11.9

## 2016-02-02 HISTORY — DX: Major depressive disorder, single episode, unspecified: F32.9

## 2016-02-02 HISTORY — DX: Concussion with loss of consciousness status unknown, initial encounter: S06.0XAA

## 2016-02-02 HISTORY — DX: Depression, unspecified: F32.A

## 2016-02-02 HISTORY — DX: Gastro-esophageal reflux disease without esophagitis: K21.9

## 2016-02-02 HISTORY — DX: Unspecified osteoarthritis, unspecified site: M19.90

## 2016-02-02 LAB — COMPREHENSIVE METABOLIC PANEL
ALBUMIN: 4.2 g/dL (ref 3.5–5.0)
ALK PHOS: 68 U/L (ref 38–126)
ALT: 20 U/L (ref 17–63)
AST: 18 U/L (ref 15–41)
Anion gap: 10 (ref 5–15)
BUN: 14 mg/dL (ref 6–20)
CALCIUM: 9.7 mg/dL (ref 8.9–10.3)
CO2: 26 mmol/L (ref 22–32)
CREATININE: 1.15 mg/dL (ref 0.61–1.24)
Chloride: 95 mmol/L — ABNORMAL LOW (ref 101–111)
GFR calc Af Amer: >60 mL/min (ref >60)
GFR calc non Af Amer: >60 mL/min (ref >60)
GLUCOSE: 201 mg/dL — AB (ref 65–99)
Potassium: 3.9 mmol/L (ref 3.5–5.1)
Sodium: 131 mmol/L — ABNORMAL LOW (ref 135–145)
Total Bilirubin: 0.7 mg/dL (ref 0.3–1.2)
Total Protein: 7.3 g/dL (ref 6.5–8.1)

## 2016-02-02 LAB — CBC
HCT: 35.8 % — ABNORMAL LOW (ref 39.0–52.0)
Hemoglobin: 11.8 g/dL — ABNORMAL LOW (ref 13.0–17.0)
MCH: 28.2 pg (ref 26.0–34.0)
MCHC: 33 g/dL (ref 30.0–36.0)
MCV: 85.4 fL (ref 78.0–100.0)
PLATELETS: 361 10*3/uL (ref 150–400)
RBC: 4.19 MIL/uL — ABNORMAL LOW (ref 4.22–5.81)
RDW: 12.8 % (ref 11.5–15.5)
WBC: 6.6 10*3/uL (ref 4.0–10.5)

## 2016-02-02 LAB — URINALYSIS, ROUTINE W REFLEX MICROSCOPIC
Bilirubin Urine: NEGATIVE
Glucose, UA: NEGATIVE mg/dL
HGB URINE DIPSTICK: NEGATIVE
Ketones, ur: NEGATIVE mg/dL
LEUKOCYTES UA: NEGATIVE
NITRITE: NEGATIVE
PROTEIN: NEGATIVE mg/dL
Specific Gravity, Urine: 1.025 (ref 1.005–1.030)
pH: 5 (ref 5.0–8.0)

## 2016-02-02 LAB — TYPE AND SCREEN
ABO/RH(D): O POS
Antibody Screen: NEGATIVE

## 2016-02-02 LAB — ABO/RH: ABO/RH(D): O POS

## 2016-02-02 LAB — GLUCOSE, CAPILLARY: GLUCOSE-CAPILLARY: 221 mg/dL — AB (ref 65–99)

## 2016-02-02 LAB — PROTIME-INR
INR: 1.08
Prothrombin Time: 14.1 seconds (ref 11.4–15.2)

## 2016-02-02 LAB — SURGICAL PCR SCREEN
MRSA, PCR: NEGATIVE
STAPHYLOCOCCUS AUREUS: NEGATIVE

## 2016-02-02 LAB — APTT: aPTT: 28 seconds (ref 24–36)

## 2016-02-02 NOTE — Pre-Procedure Instructions (Addendum)
Brian Pratt  02/02/2016      [facility] [address], [place] [id number] [place] [id number] Phone: [phone] Fax: [phone]    Your procedure is scheduled on December 1  Report to [place] at 0730 A.M.  Call this number if you have problems the morning of surgery:  [phone]   Remember:  Do not eat food or drink liquids after midnight.   Take these medicines the morning of surgery with A SIP OF WATER albuterol-ipratropium (COMBIVENT), gabapentin (NEURONTIN),  omeprazole (PRILOSEC), eye drops    WHAT DO I DO ABOUT MY DIABETES MEDICATION?   Marland Kitchen Do not take oral diabetes medicines (pills) the morning of surgery. METFORMIN OR GLIPIZIDE   7 days prior to surgery STOP taking any Aspirin, Aleve, Naproxen, Ibuprofen, Motrin, Advil, Goody's, BC's, all herbal medications, fish oil, and all vitamins    How to Manage Your Diabetes Before and After Surgery  Why is it important to control my blood sugar before and after surgery? . Improving blood sugar levels before and after surgery helps healing and can limit problems. . A way of improving blood sugar control is eating a healthy diet by: o  Eating less sugar and carbohydrates o  Increasing activity/exercise o  Talking with your doctor about reaching your blood sugar goals . High blood sugars (greater than 180 mg/dL) can raise your risk of infections and slow your recovery, so you will need to focus on controlling your diabetes during the weeks before surgery. . Make sure that the doctor who takes care of your diabetes knows about your planned surgery including the date and location.  How do I manage my blood sugar before surgery? . Check your blood sugar at least 4 times a day, starting 2 days before surgery, to make sure that the level is not too high or low. o Check your blood sugar the morning of your surgery when you wake up and every 2 hours until you  get to the Short Stay unit. . If your blood sugar is less than 70 mg/dL, you will need to treat for low blood sugar: o Do not take insulin. o Treat a low blood sugar (less than 70 mg/dL) with  cup of clear juice (cranberry or apple), 4 glucose tablets, OR glucose gel. o Recheck blood sugar in 15 minutes after treatment (to make sure it is greater than 70 mg/dL). If your blood sugar is not greater than 70 mg/dL on recheck, call [phone] for further instructions. . Report your blood sugar to the short stay nurse when you get to Short Stay.  . If you are admitted to the hospital after surgery: o Your blood sugar will be checked by the staff and you will probably be given insulin after surgery (instead of oral diabetes medicines) to make sure you have good blood sugar levels. o The goal for blood sugar control after surgery is 80-180 mg/dL.     Do not wear jewelry.  Do not wear lotions, powders, or cologne, or deoderant.  Men may shave face and neck.  Do not bring valuables to the hospital.  [facility] is not responsible for any belongings or valuables.  Contacts, dentures or bridgework may not be worn into surgery.  Leave your suitcase in the car.  After surgery it may be brought to your room.  For patients admitted to the hospital, discharge time will be determined by your treatment  team.  Patients discharged the day of surgery will not be allowed to drive home.    Special instructions:   [place]- Preparing For Surgery  Before surgery, you can play an important role. Because skin is not sterile, your skin needs to be as free of germs as possible. You can reduce the number of germs on your skin by washing with CHG (chlorahexidine gluconate) Soap before surgery.  CHG is an antiseptic cleaner which kills germs and bonds with the skin to continue killing germs even after washing.  Please do not use if you have an allergy to CHG or antibacterial soaps. If your skin becomes  reddened/irritated stop using the CHG.  Do not shave (including legs and underarms) for at least 48 hours prior to first CHG shower. It is OK to shave your face.  Please follow these instructions carefully.   1. Shower the NIGHT BEFORE SURGERY and the MORNING OF SURGERY with CHG.   2. If you chose to wash your hair, wash your hair first as usual with your normal shampoo.  3. After you shampoo, rinse your hair and body thoroughly to remove the shampoo.  4. Use CHG as you would any other liquid soap. You can apply CHG directly to the skin and wash gently with a scrungie or a clean washcloth.   5. Apply the CHG Soap to your body ONLY FROM THE NECK DOWN.  Do not use on open wounds or open sores. Avoid contact with your eyes, ears, mouth and genitals (private parts). Wash genitals (private parts) with your normal soap.  6. Wash thoroughly, paying special attention to the area where your surgery will be performed.  7. Thoroughly rinse your body with warm water from the neck down.  8. DO NOT shower/wash with your normal soap after using and rinsing off the CHG Soap.  9. Pat yourself dry with a CLEAN TOWEL.   10. Wear CLEAN PAJAMAS   11. Place CLEAN SHEETS on your bed the night of your first shower and DO NOT SLEEP WITH PETS.    Day of Surgery: Do not apply any deodorants/lotions. Please wear clean clothes to the hospital/surgery center.      Please read over the following fact sheets that you were given.

## 2016-02-02 NOTE — Progress Notes (Signed)
[facility] Cardi[facility] - denies  Chest x-ray - not needed EKG - 11/16/15 Stress Test - requesting from [place] ECHO - requesting from [facility] Cardiac Cath - 11/16/15   Sending to anesthesia for review of records requesting and for abnormal EKG  Patient stated that he is having difficulty with his blood sugar being elevated.  Patient stated that he has no way of checking his sugars at home because his meter broke about 3 months ago.  His PCP is aware and is trying to get him a new one.  He has an appointment tomorrow for glucose management.  Instructed patient to call short stay if there were any new medications added    Patient denies shortness of breath, fever, cough and chest pain at PAT appointment

## 2016-02-03 NOTE — Progress Notes (Addendum)
Anesthesia Chart Review: Patient is a 70 year old male scheduled for right femoral endarterectomy, right-left FFBG on 02/05/16 by Dr. Donnetta Hutching.   History includes smoking, DM2, neuropathy, GERD, HTN, PAD, PTSD, depression, concussion (age 52), appendectomy, left TKA.   PCP is thru the [facility]. He does not see a cardiologist. He lives in [place].  Meds includes Combivent, Excedrin, Lipitor, glipizide, lisinopril-HCTZ, metformin, omeprazole, Viagra, Triple 3 Omega.   BP 130/61   Pulse 74   Temp 36.4 C   Resp 20   Ht 6' (1.829 m)   Wt 190 lb 8 oz (86.4 kg)   SpO2 100%   BMI 25.84 kg/m   11/16/15 EKG: SR with first degree AV block, PACs. He thought he had a prior stress and echo at the [facility]--records requested.  Preoperative labs noted. Na 131, K 3.9, Cr 1.15, glucose 201. H/H 11.8/35.8. PT/PTT WNL. T&S done. UA negative for leukocytes and nitrites. A1c requested from the [facility].  Will follow-up once additional records received.  Brian Pratt [facility] Short Stay Center/Anesthesiology Phone [phone] 02/03/2016 3:49 PM  Addendum: [facility] records received. Patient was seen by Dr. Earline Mayotte at the [facility] on 02/03/16. Dr. Doneta Public is aware of surgery plans. A1c was up to 10.2 on 01/21/16, so Lantus 10 units SQ daily was started. Metformin and glipizide were continued. I did not receive a stress test or echo. (I called patient and he thought they were done about 2-3 years ago as part of a routine physical. He believes he had a chemical stress test. He denied any known CAD/MI/CHF history. Cardiac records re-requested from the [facility].)  Brian Pratt [facility] Short Stay Center/Anesthesiology Phone [phone] 02/03/2016 4:58 PM  Addendum: I sent Dr. Donnetta Hutching (and [facility] RNs Zigmund Daniel and [place]) a staff message regarding elevated A1c. No additional [place] records received, so I re-requested records. (Update 02/04/16 4:52 PM: No additional records received from the [facility]  despite multiple requests. I'm not even sure if it was actual cardiac testing that he had, and if so it was several years ago. He denied CP and SOB. No known CAD, although with risk factors. EKG without worrisome ST abnormalities. Recent PCP evaluation this week who is aware of surgery plans. He added insulin, but otherwise no additional testing. Further evaluation on the day of surgery by his surgeon and anesthesiologist. But if CBG acceptable and otherwise no acute changes then I am anticipating that he can proceed as planned.)   Brian Pratt [facility] Short Stay Center/Anesthesiology Phone [phone] 02/04/2016 12:21 PM

## 2016-02-05 ENCOUNTER — Inpatient Hospital Stay (HOSPITAL_COMMUNITY): Payer: Non-veteran care | Admitting: Vascular Surgery

## 2016-02-05 ENCOUNTER — Encounter (HOSPITAL_COMMUNITY): Payer: Self-pay | Admitting: Anesthesiology

## 2016-02-05 ENCOUNTER — Inpatient Hospital Stay (HOSPITAL_COMMUNITY): Payer: Non-veteran care

## 2016-02-05 ENCOUNTER — Encounter (HOSPITAL_COMMUNITY)
Admission: RE | Disposition: A | Discharge status: Home / Self Care | Payer: Self-pay | Source: Ambulatory Visit | Attending: Vascular Surgery

## 2016-02-05 ENCOUNTER — Inpatient Hospital Stay (HOSPITAL_COMMUNITY)
Admission: RE | Admit: 2016-02-05 | Discharge: 2016-02-07 | DRG: 254 | Disposition: A | Discharge status: Home / Self Care | Payer: Non-veteran care | Source: Ambulatory Visit | Attending: Vascular Surgery | Admitting: Vascular Surgery

## 2016-02-05 DIAGNOSIS — E1140 Type 2 diabetes mellitus with diabetic neuropathy, unspecified: Secondary | ICD-10-CM | POA: Diagnosis present

## 2016-02-05 DIAGNOSIS — Z9889 Other specified postprocedural states: Secondary | ICD-10-CM

## 2016-02-05 DIAGNOSIS — E1151 Type 2 diabetes mellitus with diabetic peripheral angiopathy without gangrene: Principal | ICD-10-CM | POA: Diagnosis present

## 2016-02-05 DIAGNOSIS — F329 Major depressive disorder, single episode, unspecified: Secondary | ICD-10-CM | POA: Diagnosis present

## 2016-02-05 DIAGNOSIS — I10 Essential (primary) hypertension: Secondary | ICD-10-CM | POA: Diagnosis present

## 2016-02-05 DIAGNOSIS — I739 Peripheral vascular disease, unspecified: Secondary | ICD-10-CM

## 2016-02-05 DIAGNOSIS — F4310 Post-traumatic stress disorder, unspecified: Secondary | ICD-10-CM | POA: Diagnosis present

## 2016-02-05 DIAGNOSIS — I70212 Atherosclerosis of native arteries of extremities with intermittent claudication, left leg: Secondary | ICD-10-CM | POA: Diagnosis present

## 2016-02-05 DIAGNOSIS — Z09 Encounter for follow-up examination after completed treatment for conditions other than malignant neoplasm: Secondary | ICD-10-CM

## 2016-02-05 DIAGNOSIS — I70222 Atherosclerosis of native arteries of extremities with rest pain, left leg: Secondary | ICD-10-CM | POA: Diagnosis not present

## 2016-02-05 DIAGNOSIS — I70213 Atherosclerosis of native arteries of extremities with intermittent claudication, bilateral legs: Secondary | ICD-10-CM | POA: Diagnosis not present

## 2016-02-05 DIAGNOSIS — Z7984 Long term (current) use of oral hypoglycemic drugs: Secondary | ICD-10-CM | POA: Diagnosis not present

## 2016-02-05 DIAGNOSIS — K219 Gastro-esophageal reflux disease without esophagitis: Secondary | ICD-10-CM | POA: Diagnosis present

## 2016-02-05 DIAGNOSIS — M1990 Unspecified osteoarthritis, unspecified site: Secondary | ICD-10-CM | POA: Diagnosis present

## 2016-02-05 DIAGNOSIS — I70201 Unspecified atherosclerosis of native arteries of extremities, right leg: Secondary | ICD-10-CM | POA: Diagnosis present

## 2016-02-05 DIAGNOSIS — F17210 Nicotine dependence, cigarettes, uncomplicated: Secondary | ICD-10-CM | POA: Diagnosis present

## 2016-02-05 HISTORY — PX: ANGIOPLASTY ILLIAC ARTERY: SHX5720

## 2016-02-05 HISTORY — DX: Peripheral vascular disease, unspecified: I73.9

## 2016-02-05 HISTORY — PX: INTRAOPERATIVE ARTERIOGRAM: SHX5157

## 2016-02-05 HISTORY — PX: FEMORAL-FEMORAL BYPASS GRAFT: SHX936

## 2016-02-05 HISTORY — PX: ENDARTERECTOMY FEMORAL: SHX5804

## 2016-02-05 LAB — CBC
HCT: 29.1 % — ABNORMAL LOW (ref 39.0–52.0)
HEMOGLOBIN: 9.3 g/dL — AB (ref 13.0–17.0)
MCH: 27.4 pg (ref 26.0–34.0)
MCHC: 32 g/dL (ref 30.0–36.0)
MCV: 85.8 fL (ref 78.0–100.0)
PLATELETS: 268 10*3/uL (ref 150–400)
RBC: 3.39 MIL/uL — AB (ref 4.22–5.81)
RDW: 13.1 % (ref 11.5–15.5)
WBC: 12.4 10*3/uL — ABNORMAL HIGH (ref 4.0–10.5)

## 2016-02-05 LAB — GLUCOSE, CAPILLARY
GLUCOSE-CAPILLARY: 217 mg/dL — AB (ref 65–99)
Glucose-Capillary: 248 mg/dL — ABNORMAL HIGH (ref 65–99)
Glucose-Capillary: 238 mg/dL — ABNORMAL HIGH (ref 65–99)
Glucose-Capillary: 238 mg/dL — ABNORMAL HIGH (ref 65–99)
Glucose-Capillary: 187 mg/dL — ABNORMAL HIGH (ref 65–99)
Glucose-Capillary: 245 mg/dL — ABNORMAL HIGH (ref 65–99)

## 2016-02-05 LAB — CREATININE, SERUM
CREATININE: 1.19 mg/dL (ref 0.61–1.24)
GFR calc Af Amer: >60 mL/min (ref >60)

## 2016-02-05 SURGERY — ENDARTERECTOMY, FEMORAL
Anesthesia: General | Site: Groin | Laterality: Right

## 2016-02-05 MED ORDER — MIDAZOLAM HCL 2 MG/2ML IJ SOLN
INTRAMUSCULAR | Status: AC
  Filled 2016-02-05: qty 2

## 2016-02-05 MED ORDER — INSULIN ASPART 100 UNIT/ML ~~LOC~~ SOLN
0.0000 [IU] | Freq: Three times a day (TID) | SUBCUTANEOUS | Status: DC
  Administered 2016-02-06 (×2): 3 [IU] via SUBCUTANEOUS
  Administered 2016-02-07: 2 [IU] via SUBCUTANEOUS

## 2016-02-05 MED ORDER — CLOPIDOGREL BISULFATE 75 MG PO TABS
75.0000 mg | ORAL_TABLET | Freq: Every day | ORAL | Status: DC
  Administered 2016-02-07: 75 mg via ORAL
  Filled 2016-02-05: qty 1

## 2016-02-05 MED ORDER — IPRATROPIUM-ALBUTEROL 18-103 MCG/ACT IN AERO: 2.0000 | INHALATION_SPRAY | Freq: Two times a day (BID) | RESPIRATORY_TRACT | Status: DC

## 2016-02-05 MED ORDER — MORPHINE SULFATE (PF) 2 MG/ML IV SOLN: 2.0000 mg | INTRAVENOUS | Status: DC | PRN

## 2016-02-05 MED ORDER — SODIUM CHLORIDE 0.9 % IV SOLN: INTRAVENOUS | Status: DC

## 2016-02-05 MED ORDER — FENTANYL CITRATE (PF) 100 MCG/2ML IJ SOLN
INTRAMUSCULAR | Status: AC
  Filled 2016-02-05: qty 2

## 2016-02-05 MED ORDER — LACTATED RINGERS IV SOLN
INTRAVENOUS | Status: DC
  Administered 2016-02-05 (×4): via INTRAVENOUS

## 2016-02-05 MED ORDER — LISINOPRIL-HYDROCHLOROTHIAZIDE 20-25 MG PO TABS: 1.0000 | ORAL_TABLET | Freq: Every day | ORAL | Status: DC

## 2016-02-05 MED ORDER — SENNOSIDES-DOCUSATE SODIUM 8.6-50 MG PO TABS: 1.0000 | ORAL_TABLET | Freq: Every evening | ORAL | Status: DC | PRN

## 2016-02-05 MED ORDER — MAGNESIUM SULFATE 2 GM/50ML IV SOLN
2.0000 g | Freq: Every day | INTRAVENOUS | Status: DC | PRN
  Filled 2016-02-05: qty 50

## 2016-02-05 MED ORDER — OXYCODONE-ACETAMINOPHEN 5-325 MG PO TABS
ORAL_TABLET | ORAL | Status: AC
  Administered 2016-02-05: 2 via ORAL
  Filled 2016-02-05: qty 2

## 2016-02-05 MED ORDER — IPRATROPIUM-ALBUTEROL 0.5-2.5 (3) MG/3ML IN SOLN
3.0000 mL | Freq: Two times a day (BID) | RESPIRATORY_TRACT | Status: DC
  Administered 2016-02-05 – 2016-02-06 (×3): 3 mL via RESPIRATORY_TRACT
  Filled 2016-02-05 (×4): qty 3

## 2016-02-05 MED ORDER — PHENYLEPHRINE HCL 10 MG/ML IJ SOLN
INTRAVENOUS | Status: DC | PRN
  Administered 2016-02-05: 50 ug/min via INTRAVENOUS

## 2016-02-05 MED ORDER — POLYVINYL ALCOHOL 1.4 % OP SOLN
1.0000 [drp] | OPHTHALMIC | Status: DC | PRN
  Filled 2016-02-05: qty 15

## 2016-02-05 MED ORDER — HYDROMORPHONE HCL 1 MG/ML IJ SOLN
INTRAMUSCULAR | Status: AC
  Filled 2016-02-05: qty 0.5

## 2016-02-05 MED ORDER — PROTAMINE SULFATE 10 MG/ML IV SOLN
INTRAVENOUS | Status: DC | PRN
  Administered 2016-02-05: 50 mg via INTRAVENOUS

## 2016-02-05 MED ORDER — PROPOFOL 10 MG/ML IV BOLUS
INTRAVENOUS | Status: AC
  Filled 2016-02-05: qty 40

## 2016-02-05 MED ORDER — GUAIFENESIN-DM 100-10 MG/5ML PO SYRP: 15.0000 mL | ORAL_SOLUTION | ORAL | Status: DC | PRN

## 2016-02-05 MED ORDER — ARTIFICIAL TEARS OP OINT
TOPICAL_OINTMENT | OPHTHALMIC | Status: AC
  Filled 2016-02-05: qty 3.5

## 2016-02-05 MED ORDER — 0.9 % SODIUM CHLORIDE (POUR BTL) OPTIME
TOPICAL | Status: DC | PRN
  Administered 2016-02-05: 2000 mL

## 2016-02-05 MED ORDER — ONDANSETRON HCL 4 MG/2ML IJ SOLN
INTRAMUSCULAR | Status: AC
  Filled 2016-02-05: qty 2

## 2016-02-05 MED ORDER — CLOPIDOGREL BISULFATE 300 MG PO TABS
300.0000 mg | ORAL_TABLET | Freq: Once | ORAL | Status: AC
  Administered 2016-02-06: 300 mg via ORAL
  Filled 2016-02-05: qty 1

## 2016-02-05 MED ORDER — EPHEDRINE SULFATE 50 MG/ML IJ SOLN
INTRAMUSCULAR | Status: DC | PRN
  Administered 2016-02-05: 5 mg via INTRAVENOUS
  Administered 2016-02-05 (×3): 10 mg via INTRAVENOUS
  Administered 2016-02-05: 5 mg via INTRAVENOUS
  Administered 2016-02-05: 10 mg via INTRAVENOUS

## 2016-02-05 MED ORDER — IODIXANOL 320 MG/ML IV SOLN
INTRAVENOUS | Status: DC | PRN
  Administered 2016-02-05: 100 mL via INTRA_ARTERIAL

## 2016-02-05 MED ORDER — DEXTROSE 5 % IV SOLN
1.5000 g | INTRAVENOUS | Status: DC
  Filled 2016-02-05: qty 1.5

## 2016-02-05 MED ORDER — ACETAMINOPHEN 325 MG PO TABS: 325.0000 mg | ORAL_TABLET | ORAL | Status: DC | PRN

## 2016-02-05 MED ORDER — PROPOFOL 10 MG/ML IV BOLUS
INTRAVENOUS | Status: DC | PRN
  Administered 2016-02-05: 130 mg via INTRAVENOUS
  Administered 2016-02-05: 30 mg via INTRAVENOUS

## 2016-02-05 MED ORDER — GLYCOPYRROLATE 0.2 MG/ML IV SOSY
PREFILLED_SYRINGE | INTRAVENOUS | Status: AC
  Filled 2016-02-05: qty 3

## 2016-02-05 MED ORDER — METOPROLOL TARTRATE 5 MG/5ML IV SOLN: 2.0000 mg | INTRAVENOUS | Status: DC | PRN

## 2016-02-05 MED ORDER — ALUM & MAG HYDROXIDE-SIMETH 200-200-20 MG/5ML PO SUSP: 15.0000 mL | ORAL | Status: DC | PRN

## 2016-02-05 MED ORDER — SUGAMMADEX SODIUM 200 MG/2ML IV SOLN
INTRAVENOUS | Status: DC | PRN
  Administered 2016-02-05: 170 mg via INTRAVENOUS

## 2016-02-05 MED ORDER — DEXTROSE 5 % IV SOLN
1.5000 g | Freq: Two times a day (BID) | INTRAVENOUS | Status: AC
  Administered 2016-02-06 (×2): 1.5 g via INTRAVENOUS
  Filled 2016-02-05 (×2): qty 1.5

## 2016-02-05 MED ORDER — SODIUM CHLORIDE 0.9 % IV SOLN
INTRAVENOUS | Status: DC
  Administered 2016-02-06: 07:00:00 via INTRAVENOUS

## 2016-02-05 MED ORDER — ENOXAPARIN SODIUM 40 MG/0.4ML ~~LOC~~ SOLN
40.0000 mg | SUBCUTANEOUS | Status: DC
  Administered 2016-02-06: 40 mg via SUBCUTANEOUS
  Filled 2016-02-05 (×2): qty 0.4

## 2016-02-05 MED ORDER — SODIUM CHLORIDE 0.9 % IV SOLN: 500.0000 mL | Freq: Once | INTRAVENOUS | Status: DC | PRN

## 2016-02-05 MED ORDER — HYDRALAZINE HCL 20 MG/ML IJ SOLN: 5.0000 mg | INTRAMUSCULAR | Status: DC | PRN

## 2016-02-05 MED ORDER — PHENOL 1.4 % MT LIQD: 1.0000 | OROMUCOSAL | Status: DC | PRN

## 2016-02-05 MED ORDER — HYDROMORPHONE HCL 1 MG/ML IJ SOLN
0.2500 mg | INTRAMUSCULAR | Status: DC | PRN
  Administered 2016-02-05 (×2): 0.5 mg via INTRAVENOUS

## 2016-02-05 MED ORDER — SUGAMMADEX SODIUM 200 MG/2ML IV SOLN
INTRAVENOUS | Status: AC
  Filled 2016-02-05: qty 2

## 2016-02-05 MED ORDER — FENTANYL CITRATE (PF) 100 MCG/2ML IJ SOLN
INTRAMUSCULAR | Status: DC | PRN
  Administered 2016-02-05 (×2): 25 ug via INTRAVENOUS
  Administered 2016-02-05 (×3): 50 ug via INTRAVENOUS
  Administered 2016-02-05 (×2): 25 ug via INTRAVENOUS
  Administered 2016-02-05: 50 ug via INTRAVENOUS
  Administered 2016-02-05 (×4): 25 ug via INTRAVENOUS

## 2016-02-05 MED ORDER — ATORVASTATIN CALCIUM 80 MG PO TABS
80.0000 mg | ORAL_TABLET | Freq: Every day | ORAL | Status: DC
  Administered 2016-02-05 – 2016-02-07 (×3): 80 mg via ORAL
  Filled 2016-02-05 (×3): qty 1

## 2016-02-05 MED ORDER — HYDROCHLOROTHIAZIDE 25 MG PO TABS
25.0000 mg | ORAL_TABLET | Freq: Every day | ORAL | Status: DC
  Administered 2016-02-06 – 2016-02-07 (×2): 25 mg via ORAL
  Filled 2016-02-05 (×2): qty 1

## 2016-02-05 MED ORDER — CHLORHEXIDINE GLUCONATE CLOTH 2 % EX PADS: 6.0000 | MEDICATED_PAD | Freq: Once | CUTANEOUS | Status: DC

## 2016-02-05 MED ORDER — SUCCINYLCHOLINE CHLORIDE 200 MG/10ML IV SOSY
PREFILLED_SYRINGE | INTRAVENOUS | Status: AC
  Filled 2016-02-05: qty 10

## 2016-02-05 MED ORDER — DEXTROSE 5 % IV SOLN
1.5000 g | INTRAVENOUS | Status: AC
  Administered 2016-02-05 (×2): 1.5 g via INTRAVENOUS
  Filled 2016-02-05: qty 1.5

## 2016-02-05 MED ORDER — ONDANSETRON HCL 4 MG/2ML IJ SOLN
INTRAMUSCULAR | Status: DC | PRN
  Administered 2016-02-05: 4 mg via INTRAVENOUS

## 2016-02-05 MED ORDER — BISACODYL 5 MG PO TBEC: 5.0000 mg | DELAYED_RELEASE_TABLET | Freq: Every day | ORAL | Status: DC | PRN

## 2016-02-05 MED ORDER — PROTAMINE SULFATE 10 MG/ML IV SOLN
INTRAVENOUS | Status: AC
  Filled 2016-02-05: qty 5

## 2016-02-05 MED ORDER — GABAPENTIN 300 MG PO CAPS
300.0000 mg | ORAL_CAPSULE | Freq: Four times a day (QID) | ORAL | Status: DC
  Administered 2016-02-05 – 2016-02-07 (×6): 300 mg via ORAL
  Filled 2016-02-05 (×6): qty 1

## 2016-02-05 MED ORDER — SODIUM CHLORIDE 0.9 % IV SOLN
INTRAVENOUS | Status: DC | PRN
  Administered 2016-02-05: 500 mL

## 2016-02-05 MED ORDER — DOCUSATE SODIUM 100 MG PO CAPS
100.0000 mg | ORAL_CAPSULE | Freq: Every day | ORAL | Status: DC
  Administered 2016-02-06: 100 mg via ORAL
  Filled 2016-02-05: qty 1

## 2016-02-05 MED ORDER — EPHEDRINE 5 MG/ML INJ
INTRAVENOUS | Status: AC
  Filled 2016-02-05: qty 10

## 2016-02-05 MED ORDER — HEPARIN SODIUM (PORCINE) 1000 UNIT/ML IJ SOLN
INTRAMUSCULAR | Status: AC
  Filled 2016-02-05: qty 1

## 2016-02-05 MED ORDER — ROCURONIUM BROMIDE 10 MG/ML (PF) SYRINGE
PREFILLED_SYRINGE | INTRAVENOUS | Status: AC
  Filled 2016-02-05: qty 10

## 2016-02-05 MED ORDER — ALBUTEROL SULFATE HFA 108 (90 BASE) MCG/ACT IN AERS
INHALATION_SPRAY | RESPIRATORY_TRACT | Status: AC
  Filled 2016-02-05: qty 6.7

## 2016-02-05 MED ORDER — OXYCODONE-ACETAMINOPHEN 5-325 MG PO TABS
1.0000 | ORAL_TABLET | ORAL | Status: DC | PRN
  Administered 2016-02-05 – 2016-02-06 (×5): 2 via ORAL
  Administered 2016-02-07: 1 via ORAL
  Filled 2016-02-05 (×4): qty 2
  Filled 2016-02-05: qty 1

## 2016-02-05 MED ORDER — LISINOPRIL 10 MG PO TABS
20.0000 mg | ORAL_TABLET | Freq: Every day | ORAL | Status: DC
  Administered 2016-02-06 – 2016-02-07 (×2): 20 mg via ORAL
  Filled 2016-02-05: qty 1
  Filled 2016-02-05: qty 2

## 2016-02-05 MED ORDER — LABETALOL HCL 5 MG/ML IV SOLN: 10.0000 mg | INTRAVENOUS | Status: DC | PRN

## 2016-02-05 MED ORDER — LIDOCAINE 2% (20 MG/ML) 5 ML SYRINGE
INTRAMUSCULAR | Status: AC
  Filled 2016-02-05: qty 5

## 2016-02-05 MED ORDER — ARTIFICIAL TEARS OP OINT
TOPICAL_OINTMENT | OPHTHALMIC | Status: DC | PRN
  Administered 2016-02-05: 1 via OPHTHALMIC

## 2016-02-05 MED ORDER — ROCURONIUM BROMIDE 100 MG/10ML IV SOLN
INTRAVENOUS | Status: DC | PRN
  Administered 2016-02-05 (×2): 20 mg via INTRAVENOUS
  Administered 2016-02-05: 50 mg via INTRAVENOUS
  Administered 2016-02-05: 10 mg via INTRAVENOUS

## 2016-02-05 MED ORDER — FENTANYL CITRATE (PF) 100 MCG/2ML IJ SOLN
INTRAMUSCULAR | Status: AC
  Filled 2016-02-05: qty 4

## 2016-02-05 MED ORDER — MIDAZOLAM HCL 5 MG/5ML IJ SOLN
INTRAMUSCULAR | Status: DC | PRN
  Administered 2016-02-05 (×2): 1 mg via INTRAVENOUS

## 2016-02-05 MED ORDER — GLIPIZIDE 10 MG PO TABS
10.0000 mg | ORAL_TABLET | Freq: Two times a day (BID) | ORAL | Status: DC
  Administered 2016-02-06 – 2016-02-07 (×3): 10 mg via ORAL
  Filled 2016-02-05: qty 2
  Filled 2016-02-05 (×2): qty 1

## 2016-02-05 MED ORDER — PANTOPRAZOLE SODIUM 40 MG PO TBEC
40.0000 mg | DELAYED_RELEASE_TABLET | Freq: Every day | ORAL | Status: DC
  Administered 2016-02-06 – 2016-02-07 (×2): 40 mg via ORAL
  Filled 2016-02-05 (×2): qty 1

## 2016-02-05 MED ORDER — ALBUTEROL SULFATE HFA 108 (90 BASE) MCG/ACT IN AERS
INHALATION_SPRAY | RESPIRATORY_TRACT | Status: DC | PRN
  Administered 2016-02-05: 2 via RESPIRATORY_TRACT

## 2016-02-05 MED ORDER — HYDROMORPHONE HCL 1 MG/ML IJ SOLN
INTRAMUSCULAR | Status: AC
  Administered 2016-02-05: 0.5 mg via INTRAVENOUS
  Filled 2016-02-05: qty 0.5

## 2016-02-05 MED ORDER — HEPARIN SODIUM (PORCINE) 1000 UNIT/ML IJ SOLN
INTRAMUSCULAR | Status: DC | PRN
  Administered 2016-02-05: 3000 [IU] via INTRAVENOUS
  Administered 2016-02-05: 8000 [IU] via INTRAVENOUS
  Administered 2016-02-05: 2000 [IU] via INTRAVENOUS
  Administered 2016-02-05: 3000 [IU] via INTRAVENOUS

## 2016-02-05 MED ORDER — ACETAMINOPHEN 325 MG RE SUPP: 325.0000 mg | RECTAL | Status: DC | PRN

## 2016-02-05 MED ORDER — ONDANSETRON HCL 4 MG/2ML IJ SOLN
4.0000 mg | Freq: Four times a day (QID) | INTRAMUSCULAR | Status: DC | PRN
  Filled 2016-02-05: qty 2

## 2016-02-05 MED ORDER — POTASSIUM CHLORIDE CRYS ER 20 MEQ PO TBCR: 20.0000 meq | EXTENDED_RELEASE_TABLET | Freq: Every day | ORAL | Status: DC | PRN

## 2016-02-05 MED ORDER — LIDOCAINE HCL (CARDIAC) 20 MG/ML IV SOLN
INTRAVENOUS | Status: DC | PRN
  Administered 2016-02-05: 70 mg via INTRAVENOUS

## 2016-02-05 SURGICAL SUPPLY — 74 items
BALLN MUSTANG 6.0X20 75 (BALLOONS) ×4
BALLN MUSTANG 6.0X20 75CM (BALLOONS) ×1
BALLOON MUSTANG 6.0X20 75 (BALLOONS) ×3 IMPLANT
BANDAGE ESMARK 6X9 LF (GAUZE/BANDAGES/DRESSINGS) IMPLANT
BNDG ESMARK 6X9 LF (GAUZE/BANDAGES/DRESSINGS)
CANISTER SUCTION 2500CC (MISCELLANEOUS) ×5 IMPLANT
CANNULA VESSEL 3MM 2 BLNT TIP (CANNULA) ×10 IMPLANT
CATH ANGIO 5F BER2 65CM (CATHETERS) ×5 IMPLANT
CATH STRAIGHT 5FR 65CM (CATHETERS) ×5 IMPLANT
CLIP LIGATING EXTRA MED SLVR (CLIP) ×5 IMPLANT
CLIP LIGATING EXTRA SM BLUE (MISCELLANEOUS) ×5 IMPLANT
CUFF TOURNIQUET SINGLE 18IN (TOURNIQUET CUFF) IMPLANT
CUFF TOURNIQUET SINGLE 24IN (TOURNIQUET CUFF) IMPLANT
CUFF TOURNIQUET SINGLE 34IN LL (TOURNIQUET CUFF) IMPLANT
CUFF TOURNIQUET SINGLE 44IN (TOURNIQUET CUFF) IMPLANT
DERMABOND ADVANCED (GAUZE/BANDAGES/DRESSINGS) ×2
DERMABOND ADVANCED .7 DNX12 (GAUZE/BANDAGES/DRESSINGS) ×3 IMPLANT
DRAIN SNY 10X20 3/4 PERF (WOUND CARE) IMPLANT
DRAPE C-ARM 42X72 X-RAY (DRAPES) ×5 IMPLANT
DRAPE X-RAY CASS 24X20 (DRAPES) IMPLANT
DRSG COVADERM 4X10 (GAUZE/BANDAGES/DRESSINGS) IMPLANT
DRSG COVADERM 4X8 (GAUZE/BANDAGES/DRESSINGS) IMPLANT
ELECT REM PT RETURN 9FT ADLT (ELECTROSURGICAL) ×5
ELECTRODE REM PT RTRN 9FT ADLT (ELECTROSURGICAL) ×3 IMPLANT
EVACUATOR SILICONE 100CC (DRAIN) IMPLANT
GLOVE BIO SURGEON STRL SZ 6.5 (GLOVE) ×12 IMPLANT
GLOVE BIO SURGEONS STRL SZ 6.5 (GLOVE) ×3
GLOVE BIOGEL PI IND STRL 6.5 (GLOVE) ×9 IMPLANT
GLOVE BIOGEL PI INDICATOR 6.5 (GLOVE) ×6
GLOVE ECLIPSE 7.5 STRL STRAW (GLOVE) ×5 IMPLANT
GLOVE SKINSENSE NS SZ7.0 (GLOVE) ×4
GLOVE SKINSENSE STRL SZ7.0 (GLOVE) ×6 IMPLANT
GLOVE SS BIOGEL STRL SZ 7.5 (GLOVE) ×3 IMPLANT
GLOVE SUPERSENSE BIOGEL SZ 7.5 (GLOVE) ×2
GLOVE SURG SS PI 7.0 STRL IVOR (GLOVE) ×15 IMPLANT
GOWN SPEC L4 XLG W/TWL (GOWN DISPOSABLE) ×25 IMPLANT
GOWN STRL REUS W/ TWL LRG LVL3 (GOWN DISPOSABLE) ×15 IMPLANT
GOWN STRL REUS W/TWL LRG LVL3 (GOWN DISPOSABLE) ×10
GRAFT HEMASHIELD 8MM (Vascular Products) ×2 IMPLANT
GRAFT VASC STRG 30X8KNIT (Vascular Products) ×3 IMPLANT
INSERT FOGARTY SM (MISCELLANEOUS) ×5 IMPLANT
KIT BASIN OR (CUSTOM PROCEDURE TRAY) ×5 IMPLANT
KIT ENCORE 26 ADVANTAGE (KITS) ×5 IMPLANT
KIT ROOM TURNOVER OR (KITS) ×5 IMPLANT
LOOP VESSEL MAXI BLUE (MISCELLANEOUS) ×5 IMPLANT
LOOP VESSEL MINI RED (MISCELLANEOUS) ×5 IMPLANT
NEEDLE PERC 18GX7CM (NEEDLE) ×5 IMPLANT
NS IRRIG 1000ML POUR BTL (IV SOLUTION) ×10 IMPLANT
PACK PERIPHERAL VASCULAR (CUSTOM PROCEDURE TRAY) ×5 IMPLANT
PAD ARMBOARD 7.5X6 YLW CONV (MISCELLANEOUS) ×10 IMPLANT
PADDING CAST COTTON 6X4 STRL (CAST SUPPLIES) IMPLANT
PATCH HEMASHIELD 8X150 (Vascular Products) ×5 IMPLANT
SET COLLECT BLD 21X3/4 12 (NEEDLE) IMPLANT
SHEATH AVANTI 11CM 5FR (MISCELLANEOUS) ×5 IMPLANT
SHEATH PINNACLE 6F 10CM (SHEATH) ×5 IMPLANT
SPONGE LAP 18X18 X RAY DECT (DISPOSABLE) ×5 IMPLANT
STAPLER VISISTAT 35W (STAPLE) IMPLANT
STENT GENESIS OPTA 7X29X80 (Permanent Stent) ×5 IMPLANT
STOPCOCK 4 WAY LG BORE MALE ST (IV SETS) IMPLANT
SUT ETHILON 3 0 PS 1 (SUTURE) IMPLANT
SUT PROLENE 5 0 C 1 24 (SUTURE) ×15 IMPLANT
SUT PROLENE 6 0 CC (SUTURE) ×40 IMPLANT
SUT SILK 2 0 SH (SUTURE) ×5 IMPLANT
SUT SILK 3 0 (SUTURE) ×2
SUT SILK 3-0 18XBRD TIE 12 (SUTURE) ×3 IMPLANT
SUT VIC AB 2-0 CTX 36 (SUTURE) ×10 IMPLANT
SUT VIC AB 3-0 SH 27 (SUTURE) ×4
SUT VIC AB 3-0 SH 27X BRD (SUTURE) ×6 IMPLANT
SYR 10ML LL (SYRINGE) ×5 IMPLANT
TRAY FOLEY W/METER SILVER 16FR (SET/KITS/TRAYS/PACK) ×5 IMPLANT
TUBING EXTENTION W/L.L. (IV SETS) IMPLANT
UNDERPAD 30X30 (UNDERPADS AND DIAPERS) ×5 IMPLANT
WATER STERILE IRR 1000ML POUR (IV SOLUTION) ×5 IMPLANT
WIRE BENTSON .035X145CM (WIRE) ×5 IMPLANT

## 2016-02-05 NOTE — Anesthesia Postprocedure Evaluation (Signed)
Anesthesia Post Note  Patient: Brian Pratt  Procedure(s) Performed: Procedure(s) (LRB): Bilateral ENDARTERECTOMY FEMORAL (Right) BYPASS GRAFT RIGHT FEMORAL-LEFT FEMORAL ARTERY (Bilateral) INTRA OPERATIVE ARTERIOGRAM Right Balloon ANGIOPLASTY External  ILIAC ARTERY  Patient location during evaluation: PACU Anesthesia Type: General Level of consciousness: awake Vital Signs Assessment: post-procedure vital signs reviewed and stable Respiratory status: spontaneous breathing Cardiovascular status: stable Anesthetic complications: no    Last Vitals:  Vitals:   02/05/16 0756 02/05/16 1530  BP: (!) 171/64 (!) 132/59  Pulse: 79 76  Resp: 20 17  Temp: 36.5 C     Last Pain:  Vitals:   02/05/16 0821  TempSrc:   PainSc: 10-Worst pain ever                 EDWARDS,Analaya Hoey

## 2016-02-05 NOTE — H&P (Signed)
Office Visit   12/23/2015 Vascular and Vein Specialists -Sharman Crate, MD  Vascular Surgery   PVD (peripheral vascular disease) [facility])  Dx   PVD ; Referred by [facility]  Reason for Visit   Additional Documentation   Vitals:   BP 120/75 (BP Location: Left Arm, Patient Position: Sitting, Cuff Size: Normal)   Pulse 77   Temp  96.8 F (36 C)   Resp 18   Ht 6' (1.829 m)   Wt 201 lb (91.2 kg)   SpO2 98%   BMI 27.26 kg/m   BSA 2.15 m   Flowsheets:   Amb Nursing Assessment,   Custom Formula Data,   Vital Signs,   MEWS Score,   Anthropometrics     Encounter Info:   Billing Info,   History,   Allergies,   Detailed Report     All Notes   Progress Notes by Rosetta Posner, MD at 12/23/2015 11:00 AM   Author: Rosetta Posner, MD Author Type: Physician Filed: 12/23/2015 12:19 PM  Note Status: Signed Cosign: Cosign Not Required Encounter Date: 12/23/2015 11:00 AM  Editor: Rosetta Posner, MD (Physician)                                          Vascular and Vein Specialist of [facility]  Patient name: Brian Pratt   MRN: [id number]        DOB: 24-Mar-1945          Sex: male  REASON FOR VISIT: All up lower extremity ischemia  HPI: Brian Pratt is a 70 y.o. male today for follow-up. He underwent arteriography on 11/16/2015. 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 His superficial arteries are patent bilaterally but have diffuse disease bilaterally. He does have relatively normal runoff bilaterally. He continues to have pain that. 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 He has very clear-cut thigh claudication but this is not as limiting as the pain in his left foot. The pain in his left foot can occur at night also occurs throughout the day. He did have a ulceration on the dorsum of his foot which is completely healed on the left. He also reports new symptom of what sounds like  retrograde ejaculation. Reports this is been present for approximately 8 months. Explain the very unlikely have any association with his arterial flow and that he may want to discuss this with the urologist at the [facility].  No past medical history on file.  No family history on file.  SOCIAL HISTORY:      Social History  Substance Use Topics  . Smoking status: Current Every Day Smoker    Years: 40.00    Types: Cigarettes  . Smokeless tobacco: Never Used  . Alcohol use No    No Known Allergies        Current Outpatient Prescriptions  Medication Sig Dispense Refill  . albuterol-ipratropium (COMBIVENT) 18-103 MCG/ACT inhaler Inhale 2 puffs into the lungs 2 (two) times daily.    . Aspirin-Acetaminophen-Caffeine (EXCEDRIN PO) Take 1 tablet by mouth as needed (PAIN).    Marland Kitchen atorvastatin (LIPITOR) 80 MG tablet Take 80 mg by mouth daily.    Marland Kitchen gabapentin (NEURONTIN) 300 MG capsule Take 300 mg by mouth 2 (two) times daily.     Marland Kitchen  glipiZIDE (GLUCOTROL) 10 MG tablet Take 10 mg by mouth 2 (two) times daily before a meal.    . lisinopril-hydrochlorothiazide (PRINZIDE,ZESTORETIC) 20-25 MG tablet Take 1 tablet by mouth daily.    . metFORMIN (GLUCOPHAGE) 1000 MG tablet Take 1,000 mg by mouth 2 (two) times daily with a meal.    . omeprazole (PRILOSEC) 20 MG capsule Take 20 mg by mouth daily.    . polyvinyl alcohol (LIQUIFILM TEARS) 1.4 % ophthalmic solution Place 1 drop into both eyes as needed for dry eyes.     . sildenafil (VIAGRA) 100 MG tablet Take 100 mg by mouth daily as needed for erectile dysfunction.     No current facility-administered medications for this visit.     REVIEW OF SYSTEMS:  [X]  denotes positive finding, [ ]  denotes negative finding Cardiac  Comments:  Chest pain or chest pressure:    Shortness of breath upon exertion:    Short of breath when lying flat:    Irregular heart rhythm:        Vascular    Pain in calf, thigh,  or hip brought on by ambulation: x   Pain in feet at night that wakes you up from your sleep:  x   Blood clot in your veins:    Leg swelling:           PHYSICAL EXAM:    Vitals:   12/23/15 1114  BP: 120/75  Pulse: 77  Resp: 18  Temp: (!) 96.8 F (36 C)  SpO2: 98%  Weight: 201 lb (91.2 kg)  Height: 6' (1.829 m)    GENERAL: The patient is a well-nourished male, in no acute distress. The vital signs are documented above. CARDIOVASCULAR: No palpable popliteal or distal pulses. PULMONARY: There is good air exchange  MUSCULOSKELETAL: There are no major deformities or cyanosis. NEUROLOGIC: No focal weakness or paresthesias are detected. 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 The ulceration over the dorsum of his foot is completely healed PSYCHIATRIC: The patient has a normal affect.  DATA:  I did review his arteriogram with patient and his family present. He would be an out excellent candidate for aortobifemoral bypass grafting. Also would be a candidate for right common femoral endarterectomy and right to left femorofemoral bypass. Explained the advantages and limitations of both of these procedures to the patient and his family.  MEDICAL ISSUES: He is currently having no progression of tissue loss and has pain which may be related to arterial insufficiency or neuropathy. He wishes to continue observation only at this time. Heand notify us immediately should he develop any new ulcerations or tissue loss. Otherwise we'll see him again in 2 months for continued discussion    Rosetta Posner, MD FACS Vascular and Vein Specialists of [facility] [phone] Pager [phone]     Instructions   Patient has had progressive symptoms of limiting claudication. My last office visit he wished to continue observation only but this has progressed and now wished to proceed with the planned procedure. Risks and benefits were again  discussed  Addendum:  The patient has been re-examined and re-evaluated.  The patient's history and physical has been reviewed and is unchanged.    Brian Pratt is a 70 y.o. male is being admitted with Peripheral vascular disease with left foot rest pain I70.222. All the risks, benefits and other treatment options have been discussed with the patient. The patient has consented to proceed with Procedure(s): ENDARTERECTOMY FEMORAL  BYPASS GRAFT RIGHT FEMORAL-LEFT FEMORAL ARTERY as a surgical intervention.  Curt Jews 02/05/2016 9:12 AM Vascular and Vein Surgery

## 2016-02-05 NOTE — Transfer of Care (Signed)
Immediate Anesthesia Transfer of Care Note  Patient: Brian Pratt  Procedure(s) Performed: Procedure(s): Bilateral ENDARTERECTOMY FEMORAL (Right) BYPASS GRAFT RIGHT FEMORAL-LEFT FEMORAL ARTERY (Bilateral) INTRA OPERATIVE ARTERIOGRAM Right Balloon ANGIOPLASTY External  ILIAC ARTERY  Patient Location: PACU  Anesthesia Type:General  Level of Consciousness: awake, alert  and oriented  Airway & Oxygen Therapy: Patient Spontanous Breathing and Patient connected to nasal cannula oxygen  Post-op Assessment: Report given to RN, Post -op Vital signs reviewed and stable and Patient moving all extremities X 4  Post vital signs: Reviewed and stable  Last Vitals:  Vitals:   02/05/16 0756  BP: (!) 171/64  Pulse: 79  Resp: 20  Temp: 36.5 C    Last Pain:  Vitals:   02/05/16 0821  TempSrc:   PainSc: 10-Worst pain ever      Patients Stated Pain Goal: 3 ([id number])  Complications: No apparent anesthesia complications

## 2016-02-05 NOTE — Anesthesia Procedure Notes (Addendum)
Procedure Name: Intubation Date/Time: 02/05/2016 9:59 AM Performed by: Suzy Bouchard Pre-anesthesia Checklist: Patient identified, Emergency Drugs available, Suction available, Patient being monitored and Timeout performed Patient Re-evaluated:Patient Re-evaluated prior to inductionOxygen Delivery Method: Circle system utilized Preoxygenation: Pre-oxygenation with 100% oxygen Intubation Type: IV induction Ventilation: Mask ventilation without difficulty and Oral airway inserted - appropriate to patient size Laryngoscope Size: Sabra Heck and 2 Grade View: Grade I Tube type: Oral Tube size: 7.5 mm Number of attempts: 1 Airway Equipment and Method: Stylet Placement Confirmation: ETT inserted through vocal cords under direct vision,  positive ETCO2 and breath sounds checked- equal and bilateral Secured at: 22 cm Tube secured with: Tape Dental Injury: Teeth and Oropharynx as per pre-operative assessment

## 2016-02-05 NOTE — Anesthesia Preprocedure Evaluation (Addendum)
Anesthesia Evaluation  Patient identified by MRN, date of birth, ID band Patient awake    Reviewed: Allergy & Precautions, NPO status , Patient's Chart, lab work & pertinent test results  Airway Mallampati: I  TM Distance: >3 FB Neck ROM: Full    Dental  (+) Edentulous Upper, Edentulous Lower   Pulmonary Current Smoker,    breath sounds clear to auscultation       Cardiovascular hypertension, + Peripheral Vascular Disease   Rhythm:Regular Rate:Normal     Neuro/Psych  Neuromuscular disease    GI/Hepatic GERD  Medicated and Controlled,  Endo/Other  diabetes (prescribed Lantus 10 u QD (2 days ago- patient has not  started taking yet)), Poorly Controlled, Type 2  Renal/GU      Musculoskeletal  (+) Arthritis ,   Abdominal   Peds  Hematology   Anesthesia Other Findings   Reproductive/Obstetrics                            Anesthesia Physical Anesthesia Plan  ASA: III  Anesthesia Plan: General   Post-op Pain Management:    Induction: Intravenous  Airway Management Planned: Oral ETT  Additional Equipment:   Intra-op Plan:   Post-operative Plan: Extubation in OR  Informed Consent: I have reviewed the patients History and Physical, chart, labs and discussed the procedure including the risks, benefits and alternatives for the proposed anesthesia with the patient or authorized representative who has indicated his/her understanding and acceptance.   Dental advisory given  Plan Discussed with: CRNA and Anesthesiologist  Anesthesia Plan Comments:         Anesthesia Quick Evaluation

## 2016-02-05 NOTE — Op Note (Signed)
OPERATIVE REPORT  DATE OF SURGERY: 02/05/2016  PATIENT: Brian Pratt, 70 y.o. male MRN: [id number]  DOB: 26-Feb-1946  PRE-OPERATIVE DIAGNOSIS: Severe bilateral lower extremity claudication  POST-OPERATIVE DIAGNOSIS:  Same  PROCEDURE: #1 extensive endarterectomy and right external iliac, common femoral, superficial femoral and profundus femoris artery with Dacron patch angioplasty #2 right to left femorofemoral bypass with 8 mm Hemashield graft #3 left common femoral superficial femoral and deep femoral endarterectomy #4 right external iliac balloon angioplasty and palm stenting  SURGEON:  Curt Jews, M.D.  PHYSICIAN ASSISTANT: Silva Bandy PA-C, Leontine Locket PAC, Gerri Lins PA-C  ANESTHESIA:  Gen.  EBL: 400 ml  Total I/O In: 2400 [I.V.:2400] Out: 1275 [Urine:875; Blood:400]  BLOOD ADMINISTERED: None  DRAINS: None  SPECIMEN: None  COUNTS CORRECT:  YES  PLAN OF CARE: PACU   PATIENT DISPOSITION:  PACU - hemodynamically stable  PROCEDURE DETAILS: The patient was taken to the operative placed supine position with the area of the the lower abdomen both groins prepped in usual sterile fashion. Incision was made over the right groin and carried down to the level of the common femoral artery. The patient had extreme calcification of his common femoral artery. Dissection was continued up under the inguinal ligament and this continued up under the inguinal ligament onto the external iliac artery. Dissection was continued down onto the superficial femoral artery and deep femoral artery. These both had extreme calcification as well. The calcification in the deep femoral artery extended well past the initial branching. The superficial femoral artery was dissected further distally and the deep femoral was dissected out to the multiple side branches. Tributary branches were controlled with 2-0 silk Potts ties. Separate incision was then made in the left groin over the left femoral  artery. This again was extremely calcified with occlusion proximally. The deep femoral had less extensive plaque in it did appear that this was at the origin. The superficial femoral artery was also calcified and was encircled with a vessel loop. The patient was given 8000 units heparin. The heparin was read as multiple times during the procedure as appropriate. The external iliac artery was occluded with a Henley clamp and the superficial femoral and profundus branches were controlled with vessel loops. The common femoral artery was open with an 11 blade. The lumen was not entered because the complete circumferential extreme calcification. 11 blade was used to open up onto the external iliac artery under the inguinal ligament. The artery was endarterectomized with fevers technique under the inguinal ligament and this allowed the proximal control. The endarterectomy was continued down onto the common femoral artery and then down onto the superficial femoral artery. There was a bulky plaque at the origin of the superficial femoral artery. The fish femoral artery was patent and therefore in order to maintain flow into the superficial femoral artery and also to allow an extensive distal endarterectomy of the deep femoral artery, the superficial femoral artery was transected at its origin. The endarterectomy skin and down onto the multiple branch points of the deep femoral artery with good endpoint being obtained. The distal plaque was tacked on the superficial deep femoral arteries with 6-0 Prolene sutures. A long Finesse Hemashield Dacron patch was brought onto the field and was sewn as a patch angioplasty beginning on the external iliac and extending down onto the deep femoral artery. This was with a running 6-0 Prolene suture. There was poor flow in the external iliac artery. Arteriogram had shown diffuse plaque but it  did not appear to be flow-limiting. The patient did not have a very good inflow into this area.  The external iliac and distal branches again reoccluded. The superficial femoral artery was sewn end-to-side to the patch at the level of the Provera) kinking. This was done by reopening the patch longitudinally and the sizing ellipse of patch. Superficial femoral artery was sewn with a running 6-0 Prolene suture. Next the 84mm Hemashield graft was brought onto the field. A tunnel with had been created from the right and left groin. The Hemashield graft was brought through the tunnel. The Hemashield patch on the right common femoral artery was opened with 11 blade some Ultram Potts scissors. The graft was slightly spatulated and was sewn end-to-side to the Dacron patch with a running 5-0 Prolene suture. This anastomosis was found to be adequate. Again the flow was not particularly good through the femorofemoral bypass. The endarterectomy on the left femoral artery was less extensive. There was subtotal occlusion proximally. The common was endarterectomized with a eversion technique through the arteriotomy. The deep femoral artery was endarterectomized at its origin and plaque was tacked with 6-0 interrupted sutures. The superficial femoral artery was endarterectomized at its origin and the plaque was also tacked here with 6-0 Prolene sutures. The left limb of the femorofemoral graft was cut to appropriate length and was sewn as a long anastomosis beginning on the common and extending onto the superficial femoral artery with running 5-0 Prolene sutures. Good backbleeding was noted and the anastomosis was completed. Due to ongoing concern regarding the poor flow inflow. The decision was made to proceed with a repeat arteriography and possible stenting. 18-gauge needle was used to access the right groin through the existing Dacron patch. A Bentson wire was passed up to the level of the external iliac artery and this was washed with the C-arm. The Bentson would not pass past the level the proximal external iliac artery.  A Bernstein guiding catheter was used to direct this through the stenosis into the aorta. Hand injections through a 5 French sheath revealed the artery had good flow throughout the external iliac except at its origin. A endhole catheter was passed through this and injections this also revealed a high-grade stenosis. This initially was dilated with a 6 mm x 20 mm balloon. Again the hand injection showed that this was just past the origin of the external iliac artery. A 7 mm x 29 mm stent was brought onto the field and was balloon expandable stent was placed just at the origin of the external iliac. Final hand injection showed excellent positioning with excellent flow. There was very nice pulse resulting in the graft from this. The sheath was removed and the hole in the graft was closed with a figure-of-eight 5-0 suture. Excellent pulses noted in the superficial femoral and deep femoral arteries bilaterally. The patient was given 50 mg protamine to reverse heparin. Wounds irrigated with saline. Hemostasis tablet cautery. The wounds were closed with 2-0 Vicryl in several layers and the deep tissue and the skin was closed with 3 or septic or Vicryl sutures. Sterile dressing was applied the patient was transferred to the recovery room stable condition   Rosetta Posner, M.D., [facility] 02/05/2016 4:24 PM

## 2016-02-06 LAB — CBC
HCT: 27 % — ABNORMAL LOW (ref 39.0–52.0)
Hemoglobin: 8.6 g/dL — ABNORMAL LOW (ref 13.0–17.0)
MCH: 27.2 pg (ref 26.0–34.0)
MCHC: 31.9 g/dL (ref 30.0–36.0)
MCV: 85.4 fL (ref 78.0–100.0)
PLATELETS: 253 10*3/uL (ref 150–400)
RBC: 3.16 MIL/uL — ABNORMAL LOW (ref 4.22–5.81)
RDW: 13.1 % (ref 11.5–15.5)
WBC: 11 10*3/uL — AB (ref 4.0–10.5)

## 2016-02-06 LAB — HEMOGLOBIN A1C
HEMOGLOBIN A1C: 10.2 % — AB (ref 4.8–5.6)
MEAN PLASMA GLUCOSE: 246 mg/dL

## 2016-02-06 LAB — BASIC METABOLIC PANEL
ANION GAP: 10 (ref 5–15)
BUN: 13 mg/dL (ref 6–20)
CALCIUM: 8.7 mg/dL — AB (ref 8.9–10.3)
CO2: 26 mmol/L (ref 22–32)
CREATININE: 1.21 mg/dL (ref 0.61–1.24)
Chloride: 98 mmol/L — ABNORMAL LOW (ref 101–111)
GFR, EST NON AFRICAN AMERICAN: 59 mL/min — AB (ref >60)
Glucose, Bld: 221 mg/dL — ABNORMAL HIGH (ref 65–99)
Potassium: 4.2 mmol/L (ref 3.5–5.1)
SODIUM: 134 mmol/L — AB (ref 135–145)

## 2016-02-06 LAB — GLUCOSE, CAPILLARY
GLUCOSE-CAPILLARY: 217 mg/dL — AB (ref 65–99)
Glucose-Capillary: 229 mg/dL — ABNORMAL HIGH (ref 65–99)
Glucose-Capillary: 115 mg/dL — ABNORMAL HIGH (ref 65–99)

## 2016-02-06 MED ORDER — PANTOPRAZOLE SODIUM 40 MG PO TBEC: 40.0000 mg | DELAYED_RELEASE_TABLET | Freq: Every day | ORAL | Status: DC

## 2016-02-06 NOTE — Progress Notes (Addendum)
[facility] [place]  Subjective  - Sitting up in bedside chair.   Objective (!) 126/58 (!) 102 98.5 F (36.9 C) (Oral) 14 95%  Intake/Output Summary (Last 24 hours) at 02/06/16 0833 Last data filed at 02/06/16 0700  Gross per 24 hour  Intake          2591.67 ml  Output             2650 ml  Net           -58.33 ml    Doppler left PT. Right DP/PT Groins soft without hematoma or erythema Heart RRR   Assessment/Planning: POD #1  PROCEDURE: #1 extensive endarterectomy and right external iliac, common femoral, superficial femoral and profundus femoris artery with Dacron patch angioplasty #2 right to left femorofemoral bypass with 8 mm Hemashield graft #3 left common femoral superficial femoral and deep femoral endarterectomy #4 right external iliac balloon angioplasty and palm stenting  Will start Plavix 300 mg today then 75 mg daily PT eval and treat. Transfer to 2W, possible discharge tomorrow  Laurence Slate [facility] 02/06/2016 8:33 AM --  Laboratory Lab Results:  Recent Labs  02/05/16 1930 02/06/16 0404  WBC 12.4* 11.0*  HGB 9.3* 8.6*  HCT 29.1* 27.0*  PLT 268 253   BMET  Recent Labs  02/05/16 1930 02/06/16 0404  NA  --  134*  K  --  4.2  CL  --  98*  CO2  --  26  GLUCOSE  --  221*  BUN  --  13  CREATININE 1.19 1.21  CALCIUM  --  8.7*    COAG Lab Results  Component Value Date   INR 1.08 02/02/2016   No results found for: PTT  I have independently interviewed patient and agree with PA assessment and plan above. Progressing well, foley out and mobilize today. Can transfer to 2W when bed available.   Daud Cayer C. Donzetta Matters, MD [facility] [place] Office: [phone] Pager: [phone]

## 2016-02-06 NOTE — Evaluation (Signed)
Physical Therapy Evaluation Patient Details Name: Brian Pratt MRN: [id number] DOB: [birth year] Today's Date: 02/06/2016   History of Present Illness  pt presents with Bil Femoral Endarectomies and R to L Femoral Bypass.  pt with hx of PTSD, PAD, HTN, DM, Depression, Concussion, and Neuropathy.  Clinical Impression  Pt very motivated and follows all cueing well.  Pt will have A from family and indicates he is pre-set-up with a HHAide to A with care at D/C.  Feel pt will continue to make good progress and be able to return to home.  Will continue to follow while on acute.      Follow Up Recommendations Home health PT;Supervision - Intermittent    Equipment Recommendations  Rolling walker with 5" wheels;3in1 (PT)    Recommendations for Other Services       Precautions / Restrictions Precautions Precautions: Fall Restrictions Weight Bearing Restrictions: No      Mobility  Bed Mobility Overal bed mobility: Needs Assistance Bed Mobility: Supine to Sit     Supine to sit: Min assist     General bed mobility comments: A with steady trunk with coming to sitting.  Transfers Overall transfer level: Needs assistance Equipment used: Rolling walker (2 wheeled) Transfers: Sit to/from Stand Sit to Stand: Min guard         General transfer comment: close guarding for safety and cues for UE use.  pt moves slowly, but was able to come to/from standing without physical A.    Ambulation/Gait Ambulation/Gait assistance: Min guard Ambulation Distance (Feet): 100 Feet Assistive device: Rolling walker (2 wheeled) Gait Pattern/deviations: Step-through pattern;Decreased stride length;Trunk flexed     General Gait Details: pt moves slowly and needs cues for more upright psoture.  pt tends to want to park RW off to side prior to getting close enough to recliner.    Stairs            Wheelchair Mobility    Modified Rankin (Stroke Patients Only)       Balance Overall balance  assessment: Needs assistance Sitting-balance support: No upper extremity supported;Feet supported Sitting balance-Leahy Scale: Good     Standing balance support: Bilateral upper extremity supported;During functional activity Standing balance-Leahy Scale: Poor                               Pertinent Vitals/Pain Pain Assessment: 0-10 Pain Score: 4  Pain Location: Groin incisions Pain Descriptors / Indicators: Grimacing;Tightness (pinching) Pain Intervention(s): Monitored during session;Premedicated before session;Repositioned    Home Living Family/patient expects to be discharged to:: Private residence Living Arrangements: Alone Available Help at Discharge: Family;Personal care attendant;Available PRN/intermittently Type of Home: House Home Access: Stairs to enter Entrance Stairs-Rails: Right Entrance Stairs-Number of Steps: 4 Home Layout: One level Home Equipment: Cane - single point;Tub bench;Grab bars - tub/shower;Hand held shower head Additional Comments: [facility] indicates [facility], but is unsure when he can Pratt pick it up.      Prior Function Level of Independence: Independent with assistive device(s) (Used his cane)               Hand Dominance        Extremity/Trunk Assessment   Upper Extremity Assessment: Overall WFL for tasks assessed           Lower Extremity Assessment: Generalized weakness (hx peripheral neuropathies)      Cervical / Trunk Assessment: Kyphotic  Communication  Communication: No difficulties  Cognition Arousal/Alertness: Awake/alert Behavior During Therapy: WFL for tasks assessed/performed Overall Cognitive Status: Within Functional Limits for tasks assessed                      General Comments      Exercises     Assessment/Plan    PT Assessment Patient needs continued PT services  PT Problem List Decreased strength;Decreased activity tolerance;Decreased balance;Decreased  mobility;Decreased knowledge of use of DME;Impaired sensation;Pain          PT Treatment Interventions DME instruction;Gait training;Stair training;Functional mobility training;Therapeutic activities;Therapeutic exercise;Balance training;Patient/family education    PT Goals (Current goals can be found in the Care Plan section)  Acute Rehab PT Goals Patient Stated Goal: Walk without pain PT Goal Formulation: With patient Time For Goal Achievement: 02/13/16 Potential to Achieve Goals: Good    Frequency Min 3X/week   Barriers to discharge        Co-evaluation               End of Session Equipment Utilized During Treatment: Gait belt Activity Tolerance: Patient tolerated treatment well Patient left: in chair;with call bell/phone within reach;with family/visitor present Nurse Communication: Mobility status         Time: [id number] PT Time Calculation (min) (ACUTE ONLY): 29 min   Charges:   PT Evaluation $PT Eval Moderate Complexity: 1 Procedure PT Treatments $Gait Training: 8-22 mins   PT G CodesCatarina Hartshorn, PT  [phone] 02/06/2016, 3:04 PM

## 2016-02-07 LAB — GLUCOSE, CAPILLARY
Glucose-Capillary: 183 mg/dL — ABNORMAL HIGH (ref 65–99)
Glucose-Capillary: 175 mg/dL — ABNORMAL HIGH (ref 65–99)

## 2016-02-07 MED ORDER — OXYCODONE-ACETAMINOPHEN 5-325 MG PO TABS: 1.0000 | ORAL_TABLET | ORAL | 0 refills | Status: DC | PRN

## 2016-02-07 MED ORDER — CLOPIDOGREL BISULFATE 75 MG PO TABS: 75.0000 mg | ORAL_TABLET | Freq: Every day | ORAL | 2 refills | Status: DC

## 2016-02-07 NOTE — Care Management Note (Signed)
Case Management Note  Patient Details  Name: Brian Pratt MRN: [id number] Date of Birth: 02-Jan-1946  Subjective/Objective:                  Right Femoral artery stenosis Action/Plan: Discharge planning Expected Discharge Date:                  Expected Discharge Plan:  Home/Self Care  In-House Referral:     Discharge planning Services  CM Consult  Post Acute Care Choice:    Choice offered to:  Patient  DME Arranged:  3-N-1, Walker rolling DME Agency:  [place]:  NA [place] Agency:  NA  Status of Service:  Completed, signed off  If discussed at [place] of Stay Meetings, dates discussed:    Additional Comments: [facility] notes [facility] is [facility] (closed on wknd for authorization of home health-no [facility] agency will take until tomorrow). Pt discharging today.  CM called RN who states she will contact MD for an outpt prescription or a prescription pt can take to [facility] for [facility] authorization and setup.  CM notified [place] DME rep, Reggie to please deliver the rolling walker and 3n1 to room prior to discharge.  NO other CM needs were communicated.  Dellie Catholic, RN 02/07/2016, 9:09 AM

## 2016-02-07 NOTE — Progress Notes (Signed)
Physical Therapy Treatment Patient Details Name: PHUNG BUSSA MRN: [id number] DOB: [birth year] Today's Date: 02/07/2016    History of Present Illness pt presents with Bil Femoral Endarectomies and R to L Femoral Bypass.  pt with hx of PTSD, PAD, HTN, DM, Depression, Concussion, and Neuropathy.    PT Comments    Pt eager for D/C to home today.  Pt able to negotiate stairs and increase his ambulation distance in the hallway today.  Feel pt is ready for D/C to home from PT perspective.    Follow Up Recommendations  Home health PT;Supervision - Intermittent     Equipment Recommendations  Rolling walker with 5" wheels;3in1 (PT)    Recommendations for Other Services       Precautions / Restrictions Precautions Precautions: Fall Restrictions Weight Bearing Restrictions: No    Mobility  Bed Mobility Overal bed mobility: Modified Independent Bed Mobility: Supine to Sit     Supine to sit: Modified independent (Device/Increase time)     General bed mobility comments: pt needs increased time, but is able to complete without A today.    Transfers Overall transfer level: Needs assistance Equipment used: Rolling walker (2 wheeled) Transfers: Sit to/from Stand Sit to Stand: Supervision         General transfer comment: pt needs increased time and cues to scoot closer to EOB prior to coming to standing.    Ambulation/Gait Ambulation/Gait assistance: Supervision Ambulation Distance (Feet): 150 Feet Assistive device: Rolling walker (2 wheeled) Gait Pattern/deviations: Step-through pattern;Decreased stride length     General Gait Details: pt moving slowly, but able to increase ambulation distance from yesterday.  Less cueing for upright posture.     Stairs Stairs: Yes Stairs assistance: Min guard Stair Management: One rail Left;Two rails;Forwards;Step to pattern Number of Stairs: 11 General stair comments: pt indicates his entry steps are very similar to stairwell steps  and that he occasionally needs 2nd rail.  cues for sequencing gait, but no physical A needed, just guarding for safety.    Wheelchair Mobility    Modified Rankin (Stroke Patients Only)       Balance Overall balance assessment: Needs assistance Sitting-balance support: No upper extremity supported;Feet supported Sitting balance-Leahy Scale: Good     Standing balance support: Single extremity supported;Bilateral upper extremity supported;No upper extremity supported;During functional activity Standing balance-Leahy Scale: Fair                      Cognition Arousal/Alertness: Awake/alert Behavior During Therapy: WFL for tasks assessed/performed Overall Cognitive Status: Within Functional Limits for tasks assessed                      Exercises      General Comments        Pertinent Vitals/Pain Pain Assessment: 0-10 Pain Score: 5  Pain Location: L foot Pain Descriptors / Indicators: Aching;Grimacing Pain Intervention(s): Monitored during session;Premedicated before session;Repositioned    Home Living                      Prior Function            PT Goals (current goals can now be found in the care plan section) Acute Rehab PT Goals Patient Stated Goal: Walk without pain PT Goal Formulation: With patient Time For Goal Achievement: 02/13/16 Potential to Achieve Goals: Good Progress towards PT goals: Progressing toward goals    Frequency    Min 3X/week  PT Plan Current plan remains appropriate    Co-evaluation             End of Session Equipment Utilized During Treatment: Gait belt Activity Tolerance: Patient tolerated treatment well Patient left: in chair;with call bell/phone within reach     Time: 0739-0808 PT Time Calculation (min) (ACUTE ONLY): 29 min  Charges:  $Gait Training: 23-37 mins                    G CodesCatarina Hartshorn, [place]  [phone] 02/07/2016, 8:14 AM

## 2016-02-07 NOTE — Progress Notes (Addendum)
[facility] [place]  Subjective  - Doing well, just moving slow.  He states that his left foot is still painful with ambulation, but some better since surgery.   Objective 128/87 75 98.9 F (37.2 C) (Oral) 18 97%  Intake/Output Summary (Last 24 hours) at 02/07/16 0725 Last data filed at 02/07/16 0659  Gross per 24 hour  Intake              800 ml  Output              850 ml  Net              -50 ml    Doppler left PT. Right DP/PT Groins soft without hematoma or erythema.  Fem-fem pulse palpable Heart RRR  Assessment/Planning: POD #2  PROCEDURE: #1 extensive endarterectomy and right external iliac, common femoral, superficial femoral and profundus femoris artery with Dacron patch angioplasty #2 right to left femorofemoral bypass with 8 mm Hemashield graft #3 left common femoral superficial femoral and deep femoral endarterectomy #4 right external iliac balloon angioplasty and palm stenting   Plavix 75 mg daily Home health PT  Discharge home today f/u with Dr. Donnetta Hutching in 2-3 weeks  Theda Sers [place] 02/07/2016 7:25 AM --  Laboratory Lab Results:  Recent Labs  02/05/16 1930 02/06/16 0404  WBC 12.4* 11.0*  HGB 9.3* 8.6*  HCT 29.1* 27.0*  PLT 268 253   BMET  Recent Labs  02/05/16 1930 02/06/16 0404  NA  --  134*  K  --  4.2  CL  --  98*  CO2  --  26  GLUCOSE  --  221*  BUN  --  13  CREATININE 1.19 1.21  CALCIUM  --  8.7*    COAG Lab Results  Component Value Date   INR 1.08 02/02/2016   No results found for: PTT   I have independently interviewed patient and agree with PA assessment and plan above. [place] for discharge, will f/u with Dr. Donnetta Hutching.  Amita Atayde C. Donzetta Matters, MD [facility] [place] Office: [phone] Pager: [phone]

## 2016-02-08 DIAGNOSIS — F17210 Nicotine dependence, cigarettes, uncomplicated: Secondary | ICD-10-CM | POA: Diagnosis not present

## 2016-02-08 DIAGNOSIS — Z48812 Encounter for surgical aftercare following surgery on the circulatory system: Secondary | ICD-10-CM | POA: Diagnosis not present

## 2016-02-08 DIAGNOSIS — I739 Peripheral vascular disease, unspecified: Secondary | ICD-10-CM | POA: Diagnosis not present

## 2016-02-08 DIAGNOSIS — E785 Hyperlipidemia, unspecified: Secondary | ICD-10-CM | POA: Diagnosis not present

## 2016-02-08 DIAGNOSIS — E1140 Type 2 diabetes mellitus with diabetic neuropathy, unspecified: Secondary | ICD-10-CM | POA: Diagnosis not present

## 2016-02-08 DIAGNOSIS — M6281 Muscle weakness (generalized): Secondary | ICD-10-CM | POA: Diagnosis not present

## 2016-02-08 LAB — GLUCOSE, CAPILLARY: Glucose-Capillary: 193 mg/dL — ABNORMAL HIGH (ref 65–99)

## 2016-02-10 ENCOUNTER — Telehealth: Payer: Self-pay | Admitting: Vascular Surgery

## 2016-02-10 ENCOUNTER — Encounter (HOSPITAL_COMMUNITY): Payer: Self-pay | Admitting: Vascular Surgery

## 2016-02-10 ENCOUNTER — Telehealth: Payer: Self-pay

## 2016-02-10 DIAGNOSIS — I739 Peripheral vascular disease, unspecified: Secondary | ICD-10-CM | POA: Diagnosis not present

## 2016-02-10 DIAGNOSIS — Z48812 Encounter for surgical aftercare following surgery on the circulatory system: Secondary | ICD-10-CM | POA: Diagnosis not present

## 2016-02-10 DIAGNOSIS — E1140 Type 2 diabetes mellitus with diabetic neuropathy, unspecified: Secondary | ICD-10-CM | POA: Diagnosis not present

## 2016-02-10 DIAGNOSIS — F17210 Nicotine dependence, cigarettes, uncomplicated: Secondary | ICD-10-CM | POA: Diagnosis not present

## 2016-02-10 DIAGNOSIS — M6281 Muscle weakness (generalized): Secondary | ICD-10-CM | POA: Diagnosis not present

## 2016-02-10 DIAGNOSIS — E785 Hyperlipidemia, unspecified: Secondary | ICD-10-CM | POA: Diagnosis not present

## 2016-02-10 NOTE — Telephone Encounter (Signed)
Sched appt 02/23/16 at 10:00. Spoke to pt to inform them of appt.

## 2016-02-10 NOTE — Telephone Encounter (Signed)
-----   Message from Denman George, RN sent at 02/08/2016  4:22 PM EST ----- Regarding: needs 2-3 week f/u with Dr. Donnetta Hutching; s/p Fem-Fem BP   ----- Message ----- From: Ulyses Amor, PA-C Sent: 02/07/2016   7:46 AM To: Vvs Charge Pool  S/P fem-fem by pass needs f/u with Dr. Donnetta Hutching 2-3 weeks

## 2016-02-10 NOTE — Telephone Encounter (Signed)
S/w [place] with [facility], gave verbal orders for PT twice weekly for five weeks.

## 2016-02-10 NOTE — Discharge Summary (Signed)
[facility] Discharge Summary   Patient ID:  Brian Pratt MRN: [id number] DOB/AGE: 06-26-1945 70 y.o.  Admit date: 02/05/2016 Discharge date: 02/10/2016 Date of Surgery: 02/05/2016 Surgeon: Surgeon(s): Rosetta Posner, MD  Admission Diagnosis: Peripheral vascular disease with left foot rest pain I70.222  Discharge Diagnoses:  Peripheral vascular disease with left foot rest pain I70.222  Secondary Diagnoses: Past Medical History:  Diagnosis Date  . Arthritis   . Concussion    age 7  . Depression    history of years ago about 20 years ago  . Diabetes mellitus without complication ([place])    type 2  . GERD (gastroesophageal reflux disease)   . Hypertension   . Neuromuscular disorder (HCC)    neuropathy  . PAD (peripheral artery disease) ([place])   . PTSD (post-traumatic stress disorder)     Procedure(s): Bilateral ENDARTERECTOMY FEMORAL BYPASS GRAFT RIGHT FEMORAL-LEFT FEMORAL ARTERY INTRA OPERATIVE ARTERIOGRAM Right Balloon ANGIOPLASTY External  ILIAC ARTERY  Discharged Condition: good  HPI:  Brian Pratt a 70 y.o.maletoday for follow-up. He underwent arteriography on 11/16/2015. 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 His superficial arteries are patent bilaterally but have diffuse disease bilaterally. He does have relatively normal runoff bilaterally. He continues to have pain that. 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 He has very clear-cut thigh claudication but this is not as limiting as the pain in his left foot. The pain in his left foot can occur at night also occurs throughout the day. He did have a ulceration on the dorsum of his foot which is completely healed on the left. He also reports new symptom of what sounds like retrograde ejaculation. Reports this is been present for approximately 8 months. Explain the very unlikely have any association with his arterial  flow and that he may want to discuss this with the urologist at the [facility] Course:  Brian Pratt is a 70 y.o. male is S/P  Procedure(s): Bilateral ENDARTERECTOMY FEMORAL BYPASS GRAFT RIGHT FEMORAL-LEFT FEMORAL ARTERY INTRA OPERATIVE ARTERIOGRAM Right Balloon ANGIOPLASTY External  ILIAC ARTERY  POD#1  Doppler left PT. Right DP/PT Groins soft without hematoma or erythema Heart RRR   Assessment/Planning: POD #1  PROCEDURE: #1 extensive endarterectomy and right external iliac, common femoral, superficial femoral and profundus femoris artery with Dacron patch angioplasty #2 right to left femorofemoral bypass with 8 mm Hemashield graft #3 left common femoral superficial femoral and deep femoral endarterectomy #4 right external iliac balloon angioplasty and palm stenting  Will start Plavix 300 mg today then 75 mg daily PT eval and treat. Transfer to 2W, possible discharge tomorrow  Doppler left PT. Right DP/PT Groins soft without hematoma or erythema.  Fem-fem pulse palpable Heart RRR  Assessment/Planning: POD #2  PROCEDURE: #1 extensive endarterectomy and right external iliac, common femoral, superficial femoral and profundus femoris artery with Dacron patch angioplasty #2 right to left femorofemoral bypass with 8 mm Hemashield graft #3 left common femoral superficial femoral and deep femoral endarterectomy #4 right external iliac balloon angioplasty and palm stenting   Plavix 75 mg daily Home health PT  Discharge home today f/u with Dr. Donnetta Hutching in 2-3 weeks    Significant Diagnostic Studies: CBC Lab Results  Component Value Date   WBC 11.0 (H) 02/06/2016   HGB 8.6 (L) 02/06/2016   HCT 27.0 (L) 02/06/2016   MCV 85.4 02/06/2016   PLT 253 02/06/2016  BMET    Component Value Date/Time   NA 134 (L) 02/06/2016 0404   K 4.2 02/06/2016 0404   CL 98 (L) 02/06/2016 0404   CO2 26 02/06/2016 0404   GLUCOSE 221 (H) 02/06/2016 0404   BUN 13  02/06/2016 0404   CREATININE 1.21 02/06/2016 0404   CALCIUM 8.7 (L) 02/06/2016 0404   GFRNONAA 59 (L) 02/06/2016 0404   GFRAA >60 02/06/2016 0404   COAG Lab Results  Component Value Date   INR 1.08 02/02/2016     Disposition:  Discharge to :Home Discharge Instructions    Call MD for:  redness, tenderness, or signs of infection (pain, swelling, bleeding, redness, odor or green/yellow discharge around incision site)    Complete by:  As directed    Call MD for:  severe or increased pain, loss or decreased feeling  in affected limb(s)    Complete by:  As directed    Call MD for:  temperature >100.5    Complete by:  As directed    Discharge instructions    Complete by:  As directed    You may shower daily   Driving Restrictions    Complete by:  As directed    No driving for 2 weeks   Increase activity slowly    Complete by:  As directed    Walk with assistance use walker or cane as needed   Lifting restrictions    Complete by:  As directed    No heavy  lifting for 4 weeks   Resume previous diet    Complete by:  As directed        Medication List    TAKE these medications   albuterol-ipratropium 18-103 MCG/ACT inhaler Commonly known as:  COMBIVENT Inhale 2 puffs into the lungs 2 (two) times daily.   atorvastatin 80 MG tablet Commonly known as:  LIPITOR Take 80 mg by mouth daily.   clopidogrel 75 MG tablet Commonly known as:  PLAVIX Take 1 tablet (75 mg total) by mouth daily.   EXCEDRIN PO Take 1 tablet by mouth as needed (PAIN).   gabapentin 300 MG capsule Commonly known as:  NEURONTIN Take 300 mg by mouth 4 (four) times daily.   glipiZIDE 10 MG tablet Commonly known as:  GLUCOTROL Take 10 mg by mouth 2 (two) times daily before a meal.   lisinopril-hydrochlorothiazide 20-25 MG tablet Commonly known as:  PRINZIDE,ZESTORETIC Take 1 tablet by mouth daily.   metFORMIN 1000 MG tablet Commonly known as:  GLUCOPHAGE Take 1,000 mg by mouth 2 (two) times daily  with a meal.   multivitamin with minerals Tabs tablet Take 1 tablet by mouth daily.   omeprazole 20 MG capsule Commonly known as:  PRILOSEC Take 20 mg by mouth daily.   OVER THE COUNTER MEDICATION Take 1 capsule by mouth daily. Triple 3 Omega with Flaxseed oil and Olive oils   oxyCODONE-acetaminophen 5-325 MG tablet Commonly known as:  PERCOCET/ROXICET Take 1-2 tablets by mouth every 4 (four) hours as needed for moderate pain.   polyvinyl alcohol 1.4 % ophthalmic solution Commonly known as:  LIQUIFILM TEARS Place 1 drop into both eyes as needed for dry eyes.   sildenafil 100 MG tablet Commonly known as:  VIAGRA Take 100 mg by mouth daily as needed for erectile dysfunction.   VITAMIN C PLUS WILD ROSE HIPS PO Take 1,000 mg by mouth daily.      Verbal and written Discharge instructions given to the patient. Wound care per Discharge AVS  Follow-up Information    Early, Todd, MD Follow up in 2 day(s).   Specialties:  Vascular Surgery, Cardiology Why:  office will call Contact information: [place] [address] [place] Follow up.   Why:  rollling walker and 3n1 Contact information: [place] [address] [phone]           Signed: Laurence Slate [facility] 02/10/2016, 8:41 AM - For VQI Registry use --- Instructions: Press F2 to tab through selections.  Delete question if not applicable.   Post-op:  Wound infection: No  Graft infection: No  Transfusion: No  If yes, 0 units given New Arrhythmia: No Ipsilateral amputation: [x ] no, [ ]  Minor, [ ]  BKA, [ ]  AKA Discharge patency: [x ] Primary, [ ]  Primary assisted, [ ]  Secondary, [ ]  Occluded Patency judged by: [x ] Dopper only, [x ] Palpable graft pulse, [ ]  Palpable distal pulse, [ ]  ABI inc. > 0.15, [ ]  Duplex  D/C Ambulatory Status: Ambulatory  Complications: MI: [x ] No, [ ]  Troponin only, [ ]  EKG or Clinical CHF: No Resp  failure: [x ] none, [ ]  Pneumonia, [ ]  Ventilator Chg in renal function: [x ] none, [ ]  Inc. Cr > 0.5, [ ]  Temp. Dialysis, [ ]  Permanent dialysis Stroke: [x ] None, [ ]  Minor, [ ]  Major Return to OR: No  Reason for return to OR: [ ]  Bleeding, [ ]  Infection, [ ]  Thrombosis, [ ]  Revision  Discharge medications: Statin use:  Yes ASA use:  No  for medical reason   Plavix use:  No  for medical reason   Beta blocker use: No  for medical reason   Coumadin use: No  for medical reason

## 2016-02-12 DIAGNOSIS — E1140 Type 2 diabetes mellitus with diabetic neuropathy, unspecified: Secondary | ICD-10-CM | POA: Diagnosis not present

## 2016-02-12 DIAGNOSIS — M6281 Muscle weakness (generalized): Secondary | ICD-10-CM | POA: Diagnosis not present

## 2016-02-12 DIAGNOSIS — E785 Hyperlipidemia, unspecified: Secondary | ICD-10-CM | POA: Diagnosis not present

## 2016-02-12 DIAGNOSIS — F17210 Nicotine dependence, cigarettes, uncomplicated: Secondary | ICD-10-CM | POA: Diagnosis not present

## 2016-02-12 DIAGNOSIS — I739 Peripheral vascular disease, unspecified: Secondary | ICD-10-CM | POA: Diagnosis not present

## 2016-02-12 DIAGNOSIS — Z48812 Encounter for surgical aftercare following surgery on the circulatory system: Secondary | ICD-10-CM | POA: Diagnosis not present

## 2016-02-16 ENCOUNTER — Encounter: Payer: Self-pay | Admitting: Vascular Surgery

## 2016-02-16 DIAGNOSIS — F17210 Nicotine dependence, cigarettes, uncomplicated: Secondary | ICD-10-CM | POA: Diagnosis not present

## 2016-02-16 DIAGNOSIS — Z48812 Encounter for surgical aftercare following surgery on the circulatory system: Secondary | ICD-10-CM | POA: Diagnosis not present

## 2016-02-16 DIAGNOSIS — E785 Hyperlipidemia, unspecified: Secondary | ICD-10-CM | POA: Diagnosis not present

## 2016-02-16 DIAGNOSIS — M6281 Muscle weakness (generalized): Secondary | ICD-10-CM | POA: Diagnosis not present

## 2016-02-16 DIAGNOSIS — I739 Peripheral vascular disease, unspecified: Secondary | ICD-10-CM | POA: Diagnosis not present

## 2016-02-16 DIAGNOSIS — E1140 Type 2 diabetes mellitus with diabetic neuropathy, unspecified: Secondary | ICD-10-CM | POA: Diagnosis not present

## 2016-02-17 DIAGNOSIS — F17210 Nicotine dependence, cigarettes, uncomplicated: Secondary | ICD-10-CM | POA: Diagnosis not present

## 2016-02-17 DIAGNOSIS — M6281 Muscle weakness (generalized): Secondary | ICD-10-CM | POA: Diagnosis not present

## 2016-02-17 DIAGNOSIS — E1140 Type 2 diabetes mellitus with diabetic neuropathy, unspecified: Secondary | ICD-10-CM | POA: Diagnosis not present

## 2016-02-17 DIAGNOSIS — I739 Peripheral vascular disease, unspecified: Secondary | ICD-10-CM | POA: Diagnosis not present

## 2016-02-17 DIAGNOSIS — Z48812 Encounter for surgical aftercare following surgery on the circulatory system: Secondary | ICD-10-CM | POA: Diagnosis not present

## 2016-02-17 DIAGNOSIS — E785 Hyperlipidemia, unspecified: Secondary | ICD-10-CM | POA: Diagnosis not present

## 2016-02-18 DIAGNOSIS — E785 Hyperlipidemia, unspecified: Secondary | ICD-10-CM | POA: Diagnosis not present

## 2016-02-18 DIAGNOSIS — M6281 Muscle weakness (generalized): Secondary | ICD-10-CM | POA: Diagnosis not present

## 2016-02-18 DIAGNOSIS — F17210 Nicotine dependence, cigarettes, uncomplicated: Secondary | ICD-10-CM | POA: Diagnosis not present

## 2016-02-18 DIAGNOSIS — I739 Peripheral vascular disease, unspecified: Secondary | ICD-10-CM | POA: Diagnosis not present

## 2016-02-18 DIAGNOSIS — Z48812 Encounter for surgical aftercare following surgery on the circulatory system: Secondary | ICD-10-CM | POA: Diagnosis not present

## 2016-02-18 DIAGNOSIS — E1140 Type 2 diabetes mellitus with diabetic neuropathy, unspecified: Secondary | ICD-10-CM | POA: Diagnosis not present

## 2016-02-19 DIAGNOSIS — E1140 Type 2 diabetes mellitus with diabetic neuropathy, unspecified: Secondary | ICD-10-CM | POA: Diagnosis not present

## 2016-02-19 DIAGNOSIS — Z48812 Encounter for surgical aftercare following surgery on the circulatory system: Secondary | ICD-10-CM | POA: Diagnosis not present

## 2016-02-19 DIAGNOSIS — E785 Hyperlipidemia, unspecified: Secondary | ICD-10-CM | POA: Diagnosis not present

## 2016-02-19 DIAGNOSIS — M6281 Muscle weakness (generalized): Secondary | ICD-10-CM | POA: Diagnosis not present

## 2016-02-19 DIAGNOSIS — I739 Peripheral vascular disease, unspecified: Secondary | ICD-10-CM | POA: Diagnosis not present

## 2016-02-19 DIAGNOSIS — F17210 Nicotine dependence, cigarettes, uncomplicated: Secondary | ICD-10-CM | POA: Diagnosis not present

## 2016-02-22 DIAGNOSIS — E785 Hyperlipidemia, unspecified: Secondary | ICD-10-CM | POA: Diagnosis not present

## 2016-02-22 DIAGNOSIS — M6281 Muscle weakness (generalized): Secondary | ICD-10-CM | POA: Diagnosis not present

## 2016-02-22 DIAGNOSIS — Z48812 Encounter for surgical aftercare following surgery on the circulatory system: Secondary | ICD-10-CM | POA: Diagnosis not present

## 2016-02-22 DIAGNOSIS — E1140 Type 2 diabetes mellitus with diabetic neuropathy, unspecified: Secondary | ICD-10-CM | POA: Diagnosis not present

## 2016-02-22 DIAGNOSIS — I739 Peripheral vascular disease, unspecified: Secondary | ICD-10-CM | POA: Diagnosis not present

## 2016-02-22 DIAGNOSIS — F17210 Nicotine dependence, cigarettes, uncomplicated: Secondary | ICD-10-CM | POA: Diagnosis not present

## 2016-02-23 ENCOUNTER — Ambulatory Visit: Payer: No Typology Code available for payment source | Admitting: Vascular Surgery

## 2016-02-23 ENCOUNTER — Encounter: Payer: Self-pay | Admitting: Vascular Surgery

## 2016-02-23 ENCOUNTER — Encounter: Payer: Self-pay | Admitting: *Deleted

## 2016-02-23 ENCOUNTER — Encounter (HOSPITAL_COMMUNITY): Payer: No Typology Code available for payment source

## 2016-02-23 ENCOUNTER — Ambulatory Visit (INDEPENDENT_AMBULATORY_CARE_PROVIDER_SITE_OTHER): Payer: Self-pay | Admitting: Vascular Surgery

## 2016-02-23 VITALS — BP 101/64 | HR 70 | Temp 97.5°F | Resp 16 | Ht 72.0 in | Wt 174.0 lb

## 2016-02-23 DIAGNOSIS — I739 Peripheral vascular disease, unspecified: Secondary | ICD-10-CM

## 2016-02-23 NOTE — Progress Notes (Signed)
   Patient name: Brian Pratt MRN: [id number] DOB: [birth year] Sex: male  REASON FOR VISIT: Follow-up of recent bilateral endarterectomy and right to left femorofemoral bypass on 02/05/2016  HPI: Brian Pratt is a 70 y.o. male here today for follow-up. He had severe limiting claudication. He had extensive endarterectomy of his right external iliac, common femoral, superficial femoral and profundus femoris arteries with Dacron patch angioplasty and then a right to left femorofemoral bypass with 8 mm Hemashield. Also had extensive endarterectomy of his left common femoral artery. He did have right external iliac balloon angioplasty and [place] stenting at the same setting. He is unwell since his surgery. He is increasing his walking. He does continue to have left foot neuropathy but reports resolution of his severe claudication symptoms.  Current Outpatient Prescriptions  Medication Sig Dispense Refill  . albuterol-ipratropium (COMBIVENT) 18-103 MCG/ACT inhaler Inhale 2 puffs into the lungs 2 (two) times daily.    . Ascorbic Acid (VITAMIN C PLUS WILD ROSE HIPS PO) Take 1,000 mg by mouth daily.    . Aspirin-Acetaminophen-Caffeine (EXCEDRIN PO) Take 1 tablet by mouth as needed (PAIN).    Marland Kitchen atorvastatin (LIPITOR) 80 MG tablet Take 80 mg by mouth daily.    . clopidogrel (PLAVIX) 75 MG tablet Take 1 tablet (75 mg total) by mouth daily. 30 tablet 2  . gabapentin (NEURONTIN) 300 MG capsule Take 300 mg by mouth 4 (four) times daily.     Marland Kitchen glipiZIDE (GLUCOTROL) 10 MG tablet Take 10 mg by mouth 2 (two) times daily before a meal.    . lisinopril-hydrochlorothiazide (PRINZIDE,ZESTORETIC) 20-25 MG tablet Take 1 tablet by mouth daily.    . metFORMIN (GLUCOPHAGE) 1000 MG tablet Take 1,000 mg by mouth 2 (two) times daily with a meal.    . Multiple Vitamin (MULTIVITAMIN WITH MINERALS) TABS tablet Take 1 tablet by mouth daily.    Marland Kitchen omeprazole (PRILOSEC) 20 MG capsule Take 20 mg by  mouth daily.    Marland Kitchen OVER THE COUNTER MEDICATION Take 1 capsule by mouth daily. Triple 3 Omega with Flaxseed oil and Olive oils    . oxyCODONE-acetaminophen (PERCOCET/ROXICET) 5-325 MG tablet Take 1-2 tablets by mouth every 4 (four) hours as needed for moderate pain. 20 tablet 0  . polyvinyl alcohol (LIQUIFILM TEARS) 1.4 % ophthalmic solution Place 1 drop into both eyes as needed for dry eyes.     . sildenafil (VIAGRA) 100 MG tablet Take 100 mg by mouth daily as needed for erectile dysfunction.     No current facility-administered medications for this visit.      PHYSICAL EXAM: Vitals:   02/23/16 0959  BP: 101/64  Pulse: 70  Resp: 16  Temp: 97.5 F (36.4 C)  TempSrc: Oral  SpO2: 99%  Weight: 174 lb (78.9 kg)  Height: 6' (1.829 m)    GENERAL: The patient is a well-nourished male, in no acute distress. The vital signs are documented above. Both groin incisions are healed quite nicely. He does have easily palpable femorofemoral graft pulse and well-perfused feet  MEDICAL ISSUES: Good [place] result from extensive revascularization. Will continue his walking program. We will see him again in 3 months with repeat ankle arm indices   Rosetta Posner, MD [facility] of [facility] Tel [phone] Pager [phone]

## 2016-02-23 NOTE — Addendum Note (Signed)
Addended by: Lianne Cure A on: 02/23/2016 03:21 PM   Modules accepted: Orders

## 2016-02-24 ENCOUNTER — Encounter: Payer: Self-pay | Admitting: Vascular Surgery

## 2016-02-24 DIAGNOSIS — Z48812 Encounter for surgical aftercare following surgery on the circulatory system: Secondary | ICD-10-CM | POA: Diagnosis not present

## 2016-02-24 DIAGNOSIS — M6281 Muscle weakness (generalized): Secondary | ICD-10-CM | POA: Diagnosis not present

## 2016-02-24 DIAGNOSIS — E785 Hyperlipidemia, unspecified: Secondary | ICD-10-CM | POA: Diagnosis not present

## 2016-02-24 DIAGNOSIS — I739 Peripheral vascular disease, unspecified: Secondary | ICD-10-CM | POA: Diagnosis not present

## 2016-02-24 DIAGNOSIS — F17210 Nicotine dependence, cigarettes, uncomplicated: Secondary | ICD-10-CM | POA: Diagnosis not present

## 2016-02-24 DIAGNOSIS — E1140 Type 2 diabetes mellitus with diabetic neuropathy, unspecified: Secondary | ICD-10-CM | POA: Diagnosis not present

## 2016-02-25 DIAGNOSIS — F17210 Nicotine dependence, cigarettes, uncomplicated: Secondary | ICD-10-CM | POA: Diagnosis not present

## 2016-02-25 DIAGNOSIS — M6281 Muscle weakness (generalized): Secondary | ICD-10-CM | POA: Diagnosis not present

## 2016-02-25 DIAGNOSIS — I739 Peripheral vascular disease, unspecified: Secondary | ICD-10-CM | POA: Diagnosis not present

## 2016-02-25 DIAGNOSIS — Z48812 Encounter for surgical aftercare following surgery on the circulatory system: Secondary | ICD-10-CM | POA: Diagnosis not present

## 2016-02-25 DIAGNOSIS — E785 Hyperlipidemia, unspecified: Secondary | ICD-10-CM | POA: Diagnosis not present

## 2016-02-25 DIAGNOSIS — E1140 Type 2 diabetes mellitus with diabetic neuropathy, unspecified: Secondary | ICD-10-CM | POA: Diagnosis not present

## 2016-03-01 DIAGNOSIS — F17210 Nicotine dependence, cigarettes, uncomplicated: Secondary | ICD-10-CM | POA: Diagnosis not present

## 2016-03-01 DIAGNOSIS — Z48812 Encounter for surgical aftercare following surgery on the circulatory system: Secondary | ICD-10-CM | POA: Diagnosis not present

## 2016-03-01 DIAGNOSIS — E1140 Type 2 diabetes mellitus with diabetic neuropathy, unspecified: Secondary | ICD-10-CM | POA: Diagnosis not present

## 2016-03-01 DIAGNOSIS — M6281 Muscle weakness (generalized): Secondary | ICD-10-CM | POA: Diagnosis not present

## 2016-03-01 DIAGNOSIS — I739 Peripheral vascular disease, unspecified: Secondary | ICD-10-CM | POA: Diagnosis not present

## 2016-03-01 DIAGNOSIS — E785 Hyperlipidemia, unspecified: Secondary | ICD-10-CM | POA: Diagnosis not present

## 2016-03-03 DIAGNOSIS — E1140 Type 2 diabetes mellitus with diabetic neuropathy, unspecified: Secondary | ICD-10-CM | POA: Diagnosis not present

## 2016-03-03 DIAGNOSIS — I739 Peripheral vascular disease, unspecified: Secondary | ICD-10-CM | POA: Diagnosis not present

## 2016-03-03 DIAGNOSIS — F17210 Nicotine dependence, cigarettes, uncomplicated: Secondary | ICD-10-CM | POA: Diagnosis not present

## 2016-03-03 DIAGNOSIS — E785 Hyperlipidemia, unspecified: Secondary | ICD-10-CM | POA: Diagnosis not present

## 2016-03-03 DIAGNOSIS — M6281 Muscle weakness (generalized): Secondary | ICD-10-CM | POA: Diagnosis not present

## 2016-03-03 DIAGNOSIS — Z48812 Encounter for surgical aftercare following surgery on the circulatory system: Secondary | ICD-10-CM | POA: Diagnosis not present

## 2016-03-04 DIAGNOSIS — Z48812 Encounter for surgical aftercare following surgery on the circulatory system: Secondary | ICD-10-CM | POA: Diagnosis not present

## 2016-03-04 DIAGNOSIS — M6281 Muscle weakness (generalized): Secondary | ICD-10-CM | POA: Diagnosis not present

## 2016-03-04 DIAGNOSIS — E1140 Type 2 diabetes mellitus with diabetic neuropathy, unspecified: Secondary | ICD-10-CM | POA: Diagnosis not present

## 2016-03-04 DIAGNOSIS — I739 Peripheral vascular disease, unspecified: Secondary | ICD-10-CM | POA: Diagnosis not present

## 2016-03-04 DIAGNOSIS — F17210 Nicotine dependence, cigarettes, uncomplicated: Secondary | ICD-10-CM | POA: Diagnosis not present

## 2016-03-04 DIAGNOSIS — E785 Hyperlipidemia, unspecified: Secondary | ICD-10-CM | POA: Diagnosis not present

## 2016-03-08 DIAGNOSIS — F17210 Nicotine dependence, cigarettes, uncomplicated: Secondary | ICD-10-CM | POA: Diagnosis not present

## 2016-03-08 DIAGNOSIS — I739 Peripheral vascular disease, unspecified: Secondary | ICD-10-CM | POA: Diagnosis not present

## 2016-03-08 DIAGNOSIS — E785 Hyperlipidemia, unspecified: Secondary | ICD-10-CM | POA: Diagnosis not present

## 2016-03-08 DIAGNOSIS — Z48812 Encounter for surgical aftercare following surgery on the circulatory system: Secondary | ICD-10-CM | POA: Diagnosis not present

## 2016-03-08 DIAGNOSIS — M6281 Muscle weakness (generalized): Secondary | ICD-10-CM | POA: Diagnosis not present

## 2016-03-08 DIAGNOSIS — E1140 Type 2 diabetes mellitus with diabetic neuropathy, unspecified: Secondary | ICD-10-CM | POA: Diagnosis not present

## 2016-03-14 DIAGNOSIS — M6281 Muscle weakness (generalized): Secondary | ICD-10-CM | POA: Diagnosis not present

## 2016-03-14 DIAGNOSIS — E1140 Type 2 diabetes mellitus with diabetic neuropathy, unspecified: Secondary | ICD-10-CM | POA: Diagnosis not present

## 2016-03-14 DIAGNOSIS — I739 Peripheral vascular disease, unspecified: Secondary | ICD-10-CM | POA: Diagnosis not present

## 2016-03-14 DIAGNOSIS — F17210 Nicotine dependence, cigarettes, uncomplicated: Secondary | ICD-10-CM | POA: Diagnosis not present

## 2016-03-14 DIAGNOSIS — E785 Hyperlipidemia, unspecified: Secondary | ICD-10-CM | POA: Diagnosis not present

## 2016-03-14 DIAGNOSIS — Z48812 Encounter for surgical aftercare following surgery on the circulatory system: Secondary | ICD-10-CM | POA: Diagnosis not present

## 2016-04-13 ENCOUNTER — Encounter (HOSPITAL_COMMUNITY): Payer: Self-pay | Admitting: Vascular Surgery

## 2016-05-04 ENCOUNTER — Telehealth: Payer: Self-pay | Admitting: Vascular Surgery

## 2016-05-04 NOTE — Telephone Encounter (Signed)
Faxed Auth request to non-va care. Patient was approved in 11/2015 by the [facility] for his last visit. The auth expired in early February.

## 2016-05-13 ENCOUNTER — Encounter: Payer: Self-pay | Admitting: Vascular Surgery

## 2016-05-20 ENCOUNTER — Telehealth: Payer: Self-pay | Admitting: Vascular Surgery

## 2016-05-20 NOTE — Telephone Encounter (Signed)
Obtained [facility] authorization for patient's upcoming visits. Auth # [id number] eff 3/15-7/15 for 7 visits which include office visits and imaging.

## 2016-05-24 ENCOUNTER — Ambulatory Visit (HOSPITAL_COMMUNITY)
Admission: RE | Admit: 2016-05-24 | Discharge: 2016-05-24 | Disposition: A | Discharge status: Home / Self Care | Payer: Non-veteran care | Source: Ambulatory Visit | Attending: Vascular Surgery | Admitting: Vascular Surgery

## 2016-05-24 ENCOUNTER — Ambulatory Visit (INDEPENDENT_AMBULATORY_CARE_PROVIDER_SITE_OTHER): Payer: Non-veteran care | Admitting: Vascular Surgery

## 2016-05-24 ENCOUNTER — Encounter: Payer: Self-pay | Admitting: Vascular Surgery

## 2016-05-24 VITALS — BP 153/74 | HR 69 | Temp 97.2°F | Resp 16 | Ht 72.0 in | Wt 190.0 lb

## 2016-05-24 DIAGNOSIS — R9439 Abnormal result of other cardiovascular function study: Secondary | ICD-10-CM | POA: Diagnosis not present

## 2016-05-24 DIAGNOSIS — I739 Peripheral vascular disease, unspecified: Secondary | ICD-10-CM | POA: Diagnosis not present

## 2016-05-24 NOTE — Progress Notes (Signed)
Vascular and Vein Specialist of [place]  Patient name: Brian Pratt MRN: [id number] DOB: 1945/09/27 Sex: male  REASON FOR VISIT: Follow-up extensive revascularization procedure done in December 2017  HPI: Brian Pratt is a 71 y.o. male here today for follow-up. He has been able to walk better. He does have multiple ongoing complaints. 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 Also has severe degenerative disc disease. 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 He does have clear-cut lower from a claudication mostly in his calves. This does make it difficult for him to do routine activities that time but had no tissue loss.  Past Medical History:  Diagnosis Date  . Arthritis   . Concussion    age 87  . Depression    history of years ago about 20 years ago  . Diabetes mellitus without complication ([place])    type 2  . GERD (gastroesophageal reflux disease)   . Hypertension   . Neuromuscular disorder (HCC)    neuropathy  . PAD (peripheral artery disease) ([place])   . PTSD (post-traumatic stress disorder)     No family history on file.  SOCIAL HISTORY: Social History  Substance Use Topics  . Smoking status: Current Every Day Smoker    Packs/day: 0.20    Years: 40.00    Types: Cigarettes  . Smokeless tobacco: Never Used     Comment: 2 pks per day  . Alcohol use No    No Known Allergies  Current Outpatient Prescriptions  Medication Sig Dispense Refill  . albuterol-ipratropium (COMBIVENT) 18-103 MCG/ACT inhaler Inhale 2 puffs into the lungs 2 (two) times daily.    . Ascorbic Acid (VITAMIN C PLUS WILD ROSE HIPS PO) Take 1,000 mg by mouth daily.    . Aspirin-Acetaminophen-Caffeine (EXCEDRIN PO) Take 1 tablet by mouth as needed (PAIN).    Marland Kitchen atorvastatin (LIPITOR) 80 MG tablet Take 80 mg by mouth daily.    . clopidogrel (PLAVIX) 75 MG tablet  Take 1 tablet (75 mg total) by mouth daily. 30 tablet 2  . gabapentin (NEURONTIN) 300 MG capsule Take 300 mg by mouth 4 (four) times daily.     Marland Kitchen glipiZIDE (GLUCOTROL) 10 MG tablet Take 10 mg by mouth 2 (two) times daily before a meal.    . lisinopril-hydrochlorothiazide (PRINZIDE,ZESTORETIC) 20-25 MG tablet Take 1 tablet by mouth daily.    . metFORMIN (GLUCOPHAGE) 1000 MG tablet Take 1,000 mg by mouth 2 (two) times daily with a meal.    . Multiple Vitamin (MULTIVITAMIN WITH MINERALS) TABS tablet Take 1 tablet by mouth daily.    Marland Kitchen omeprazole (PRILOSEC) 20 MG capsule Take 20 mg by mouth daily.    Marland Kitchen OVER THE COUNTER MEDICATION Take 1 capsule by mouth daily. Triple 3 Omega with Flaxseed oil and Olive oils    . oxyCODONE-acetaminophen (PERCOCET/ROXICET) 5-325 MG tablet Take 1-2 tablets by mouth every 4 (four) hours as needed for moderate pain. 20 tablet 0  . polyvinyl alcohol (LIQUIFILM TEARS) 1.4 % ophthalmic solution Place 1 drop into both eyes as needed for dry eyes.     . sildenafil (VIAGRA) 100 MG tablet Take 100 mg by mouth daily as needed for erectile dysfunction.     No current facility-administered medications for this visit.     REVIEW OF SYSTEMS:  [X]  denotes positive finding, [ ]  denotes negative finding Cardiac  Comments:  Chest pain or chest pressure:  Shortness of breath upon exertion: x   Short of breath when lying flat:    Irregular heart rhythm:        Vascular    Pain in calf, thigh, or hip brought on by ambulation: x   Pain in feet at night that wakes you up from your sleep:  x   Blood clot in your veins:    Leg swelling:           PHYSICAL EXAM: Vitals:   05/24/16 1116  BP: (!) 153/74  Pulse: 69  Resp: 16  Temp: 97.2 F (36.2 C)  TempSrc: Oral  SpO2: 100%  Weight: 190 lb (86.2 kg)  Height: 6' (1.829 m)    GENERAL: The patient is a well-nourished male, in no acute distress. The vital signs are documented above. CARDIOVASCULAR: 2+ radial pulses  bilaterally. He does have an easily palpable femorofemoral graft pulse and 2+ femoral pulses bilaterally. Absent popliteal and distal pulses bilaterally PULMONARY: There is good air exchange  MUSCULOSKELETAL: There are no major deformities or cyanosis. NEUROLOGIC: No focal weakness or paresthesias are detected. SKIN: There are no ulcers or rashes noted. Feet are well-perfused without ulceration PSYCHIATRIC: The patient has a normal affect.  DATA:  Noninvasive studies reveal ankle arm index of 0.68 on the right and 0.53 on the left with monophasic waveforms  MEDICAL ISSUES: Stable overall. Did undergo extremely extensive iliac and femoral endarterectomies bilaterally with a right to left femorofemoral bypass and also stenting of his inflow external iliac artery. Fortunately he has maintained patency of this and has tolerable claudication. He will continue his walking program and will see [place] again in 6 months. He will notify should he develop any worsening symptoms    Rosetta Posner, MD [facility]ists of [facility] Tel [phone] Pager [phone]

## 2016-05-25 NOTE — Addendum Note (Signed)
Addended by: Lianne Cure A on: 05/25/2016 12:28 PM   Modules accepted: Orders

## 2016-05-27 DIAGNOSIS — R410 Disorientation, unspecified: Secondary | ICD-10-CM | POA: Diagnosis not present

## 2016-05-27 DIAGNOSIS — D518 Other vitamin B12 deficiency anemias: Secondary | ICD-10-CM | POA: Diagnosis not present

## 2016-05-27 DIAGNOSIS — R5383 Other fatigue: Secondary | ICD-10-CM | POA: Diagnosis not present

## 2016-05-27 DIAGNOSIS — F0280 Dementia in other diseases classified elsewhere without behavioral disturbance: Secondary | ICD-10-CM | POA: Diagnosis not present

## 2016-10-15 DIAGNOSIS — R109 Unspecified abdominal pain: Secondary | ICD-10-CM | POA: Diagnosis not present

## 2016-10-15 DIAGNOSIS — E1165 Type 2 diabetes mellitus with hyperglycemia: Secondary | ICD-10-CM | POA: Diagnosis not present

## 2016-10-15 DIAGNOSIS — E108 Type 1 diabetes mellitus with unspecified complications: Secondary | ICD-10-CM | POA: Diagnosis not present

## 2016-10-15 DIAGNOSIS — R404 Transient alteration of awareness: Secondary | ICD-10-CM | POA: Diagnosis not present

## 2016-10-15 DIAGNOSIS — E119 Type 2 diabetes mellitus without complications: Secondary | ICD-10-CM | POA: Diagnosis not present

## 2016-10-15 DIAGNOSIS — R42 Dizziness and giddiness: Secondary | ICD-10-CM | POA: Diagnosis not present

## 2016-10-15 DIAGNOSIS — R531 Weakness: Secondary | ICD-10-CM | POA: Diagnosis not present

## 2016-10-15 DIAGNOSIS — R079 Chest pain, unspecified: Secondary | ICD-10-CM | POA: Diagnosis not present

## 2016-10-15 DIAGNOSIS — D638 Anemia in other chronic diseases classified elsewhere: Secondary | ICD-10-CM | POA: Diagnosis not present

## 2016-10-15 DIAGNOSIS — C651 Malignant neoplasm of right renal pelvis: Secondary | ICD-10-CM | POA: Diagnosis not present

## 2016-10-27 DIAGNOSIS — N2889 Other specified disorders of kidney and ureter: Secondary | ICD-10-CM | POA: Diagnosis not present

## 2016-10-27 DIAGNOSIS — Z125 Encounter for screening for malignant neoplasm of prostate: Secondary | ICD-10-CM | POA: Diagnosis not present

## 2016-10-27 DIAGNOSIS — N401 Enlarged prostate with lower urinary tract symptoms: Secondary | ICD-10-CM | POA: Diagnosis not present

## 2016-11-02 DIAGNOSIS — N429 Disorder of prostate, unspecified: Secondary | ICD-10-CM | POA: Diagnosis not present

## 2016-11-02 DIAGNOSIS — N2889 Other specified disorders of kidney and ureter: Secondary | ICD-10-CM | POA: Diagnosis not present

## 2016-11-02 DIAGNOSIS — N281 Cyst of kidney, acquired: Secondary | ICD-10-CM | POA: Diagnosis not present

## 2016-11-25 DIAGNOSIS — N2889 Other specified disorders of kidney and ureter: Secondary | ICD-10-CM | POA: Diagnosis not present

## 2016-11-25 DIAGNOSIS — N401 Enlarged prostate with lower urinary tract symptoms: Secondary | ICD-10-CM | POA: Diagnosis not present

## 2016-11-29 ENCOUNTER — Ambulatory Visit (INDEPENDENT_AMBULATORY_CARE_PROVIDER_SITE_OTHER): Payer: Medicare Other | Admitting: Family

## 2016-11-29 ENCOUNTER — Encounter: Payer: Self-pay | Admitting: Family

## 2016-11-29 ENCOUNTER — Ambulatory Visit (HOSPITAL_COMMUNITY)
Admission: RE | Admit: 2016-11-29 | Discharge: 2016-11-29 | Disposition: A | Discharge status: Home / Self Care | Payer: Medicare Other | Source: Ambulatory Visit | Attending: Vascular Surgery | Admitting: Vascular Surgery

## 2016-11-29 VITALS — BP 125/61 | HR 80 | Temp 98.1°F | Resp 16 | Ht 72.0 in | Wt 179.0 lb

## 2016-11-29 DIAGNOSIS — F17200 Nicotine dependence, unspecified, uncomplicated: Secondary | ICD-10-CM

## 2016-11-29 DIAGNOSIS — I739 Peripheral vascular disease, unspecified: Secondary | ICD-10-CM | POA: Insufficient documentation

## 2016-11-29 DIAGNOSIS — I779 Disorder of arteries and arterioles, unspecified: Secondary | ICD-10-CM

## 2016-11-29 NOTE — Progress Notes (Signed)
[facility] [place]   CC: Follow up peripheral artery occlusive disease  History of Present Illness Brian HENKEN is a 71 y.o. male returns today for follow-up.  He is s/p angioplasty of right external iliac artery, bilateral femoral artery endarterectomy, and right to left fem-fem bypass graft on 02-05-16 by Dr. Donnetta Hutching.  He has been able to walk better. He does have multiple ongoing complaints. 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 Also has severe degenerative disc disease. 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 He does have claudication mostly in his calves. This does make it difficult for him to do routine activities that time but had no tissue loss.  Dr. Donnetta Hutching last evaluated pt on 05-24-16. At that time right ankle arm index of 0.68 and 0.53 on the left with monophasic waveforms Stable overall. Did undergo extremely extensive iliac and femoral endarterectomies bilaterally with a right to left femorofemoral bypass and also stenting of his inflow external iliac artery. Fortunately he has maintained patency of this and has tolerable claudication. He was to continue his walking program and will see [place] again in 6 months. He was to notify us should he develop any worsening symptoms.  He states he has no known heart problems.  He denies any known hx of stroke, but might have had a TIA May 2018 as manifested by transient confusion. He states ultrasound of his neck arteries was done at [facility] at that time; no carotid duplex results on file here.   He states his PTSD has returned and he is under tx for this.   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 Nila Nephew is his urologist in [place].   He states he walks 1/2 mile in his daily routine. He will be getting a power w/c.  He volunteers with [employer].   Pt Diabetic: Yes, last A1C result  on file was 10.2 on 02-05-16, he states his endocrinologist is Dr. Chana Bode in the [facility] Pt smoker: smoker  (2 ppd, started at age 7 yrs)  Pt meds include: Statin :Yes Betablocker: No ASA: No, pt states years ago he had anemia and blood was found in his stool, states ASA was stopped at that time Other anticoagulants/antiplatelets: pt denies taking clopidogrel which is on his medication list  Past Medical History:  Diagnosis Date  . Arthritis   . Concussion    age 40  . Depression    history of years ago about 20 years ago  . Diabetes mellitus without complication ([place])    type 2  . GERD (gastroesophageal reflux disease)   . Hypertension   . Neuromuscular disorder (HCC)    neuropathy  . PAD (peripheral artery disease) ([place])   . PTSD (post-traumatic stress disorder)     Social History Social History  Substance Use Topics  . Smoking status: Current Every Day Smoker    Packs/day: 0.20    Years: 40.00    Types: Cigarettes  . Smokeless tobacco: Never Used     Comment: 2 pks per day  . Alcohol use No    Family History No family history on file.  Past Surgical History:  Procedure Laterality Date  . ANGIOPLASTY ILLIAC ARTERY  02/05/2016   Procedure: Right Balloon ANGIOPLASTY External  ILIAC ARTERY;  Surgeon: Rosetta Posner, MD;  Location: [place];  Service: Vascular;;  . APPENDECTOMY  ruptured  . ENDARTERECTOMY FEMORAL Right 02/05/2016   Procedure: Bilateral ENDARTERECTOMY FEMORAL;  Surgeon: Rosetta Posner, MD;  Location: [place];  Service: Vascular;  Laterality: Right;  . EYE SURGERY     cataracts removed  . FEMORAL-FEMORAL BYPASS GRAFT Bilateral 02/05/2016   Procedure: BYPASS GRAFT RIGHT FEMORAL-LEFT FEMORAL ARTERY;  Surgeon: Rosetta Posner, MD;  Location: [place];  Service: Vascular;  Laterality: Bilateral;  . INTRAOPERATIVE ARTERIOGRAM  02/05/2016   Procedure: INTRA OPERATIVE ARTERIOGRAM;  Surgeon: Rosetta Posner, MD;  Location: [place];  Service: Vascular;;  . PERIPHERAL VASCULAR  CATHETERIZATION N/A 11/16/2015   Procedure: Abdominal Aortogram w/Lower Extremity;  Surgeon: Angelia Mould, MD;  Location: [place] [facility];  Service: Cardiovascular;  Laterality: N/A;  . REPLACEMENT TOTAL KNEE Left     No Known Allergies  Current Outpatient Prescriptions  Medication Sig Dispense Refill  . albuterol-ipratropium (COMBIVENT) 18-103 MCG/ACT inhaler Inhale 2 puffs into the lungs 2 (two) times daily.    . Ascorbic Acid (VITAMIN C PLUS WILD ROSE HIPS PO) Take 1,000 mg by mouth daily.    . Aspirin-Acetaminophen-Caffeine (EXCEDRIN PO) Take 1 tablet by mouth as needed (PAIN).    Marland Kitchen atorvastatin (LIPITOR) 80 MG tablet Take 80 mg by mouth daily.    . clopidogrel (PLAVIX) 75 MG tablet Take 1 tablet (75 mg total) by mouth daily. 30 tablet 2  . gabapentin (NEURONTIN) 300 MG capsule Take 300 mg by mouth 4 (four) times daily.     Marland Kitchen glipiZIDE (GLUCOTROL) 10 MG tablet Take 10 mg by mouth 2 (two) times daily before a meal.    . lisinopril-hydrochlorothiazide (PRINZIDE,ZESTORETIC) 20-25 MG tablet Take 1 tablet by mouth daily.    . metFORMIN (GLUCOPHAGE) 1000 MG tablet Take 1,000 mg by mouth 2 (two) times daily with a meal.    . Multiple Vitamin (MULTIVITAMIN WITH MINERALS) TABS tablet Take 1 tablet by mouth daily.    Marland Kitchen omeprazole (PRILOSEC) 20 MG capsule Take 20 mg by mouth daily.    Marland Kitchen OVER THE COUNTER MEDICATION Take 1 capsule by mouth daily. Triple 3 Omega with Flaxseed oil and Olive oils    . oxyCODONE-acetaminophen (PERCOCET/ROXICET) 5-325 MG tablet Take 1-2 tablets by mouth every 4 (four) hours as needed for moderate pain. 20 tablet 0  . polyvinyl alcohol (LIQUIFILM TEARS) 1.4 % ophthalmic solution Place 1 drop into both eyes as needed for dry eyes.     . sildenafil (VIAGRA) 100 MG tablet Take 100 mg by mouth daily as needed for erectile dysfunction.     No current facility-administered medications for this visit.     ROS: See HPI for pertinent positives and  negatives.   Physical Examination  Vitals:   11/29/16 1100 11/29/16 1104  BP: 121/84 125/61  Pulse: 80   Resp: 16   Temp: 98.1 F (36.7 C)   TempSrc: Oral   SpO2: 98%   Weight: 179 lb (81.2 kg)   Height: 6' (1.829 m)    Body mass index is 24.28 kg/m.  General: A&O x 3, WDWN, male. Gait: limp, antalgic, using cane Eyes: PERRLA. Pulmonary: Respirations are non labored, CTAB, slightly diminished air movement Cardiac: regular Rhythm and rate, + murmur.         Carotid Bruits Right Left   Negative Positive   Radial pulses are 1+ palpable bilaterally   Adominal aortic pulse is not palpable          Femorofemoral graft pulse is palpable  VASCULAR EXAM: Extremities without ischemic changes,  Gangrene. Healed wound on left medial malleolus.                                                                                                           LE Pulses Right Left       FEMORAL  2+ palpable  2+ palpable        POPLITEAL  not palpable   not palpable       POSTERIOR TIBIAL  not palpable   not palpable        DORSALIS PEDIS      ANTERIOR TIBIAL not palpable  1+ palpable    Abdomen: soft, NT, asymptomatic ventral hernia only present in the process of sitting up or lying down. Skin: no rashes, no ulcers noted. Musculoskeletal: no muscle wasting or atrophy.  Neurologic: A&O X 3; Appropriate Affect, sensation is severely diminished in his feet; MOTOR FUNCTION:  moving all extremities equally, motor strength 5/5 throughout. Speech is fluent/normal. CN 2-12 intact.    ASSESSMENT: MATAI CARPENITO is a 71 y.o. male who is s/p angioplasty of right external iliac artery, bilateral femoral artery endarterectomy, and right to left fem-fem bypass graft on 02-05-16.  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 Also has severe degenerative disc disease. He does have claudication  mostly in his calves. This does make it difficult for him to do routine activities that time but had no tissue loss.  His atherosclerotic risk factors include uncontrolled DM and 52 years hx of smoking, currently at 2 ppd.   DATA  ABI (Date: 11/29/2016):  R:   ABI: 0.62 (was 0.68 on 05-24-16),   PT: mono  DP: mono  TBI:  0.41  L:   ABI: 0.54 (was 0.53),   PT: mono  DP: mono  TBI: 0.27  Bilateral ABI are stable with moderate disease and all monophasic waveforms.    PLAN:  The patient was counseled re smoking cessation and given several free resources re smoking cessation.   Based on the patient's vascular studies and examination, pt will return to clinic in 6 months with ABI's; I advised him to notify us if he develops concerns re the circulation in his feet or legs.   I discussed in depth with the patient the nature of atherosclerosis, and emphasized the importance of maximal medical management including strict control of blood pressure, blood glucose, and lipid levels, obtaining regular exercise, and cessation of smoking.  The patient is aware that without maximal medical management the underlying atherosclerotic disease process will progress, limiting the benefit of any interventions.  The patient was given information about PAD including signs, symptoms, treatment, what symptoms should prompt the patient to seek immediate medical care, and risk reduction measures to take.  Clemon Chambers, RN, MSN, FNP-C [facility] Phone: [phone]  Clinic MD: Early  11/29/16 11:14 AM

## 2016-11-29 NOTE — Patient Instructions (Signed)
Steps to Quit Smoking Smoking tobacco can be bad for your health. It can also affect almost every organ in your body. Smoking puts you and people around you at risk for many serious long-lasting (chronic) diseases. Quitting smoking is hard, but it is one of the best things that you can do for your health. It is never too late to quit. What are the benefits of quitting smoking? When you quit smoking, you lower your risk for getting serious diseases and conditions. They can include:  Lung cancer or lung disease.  Heart disease.  Stroke.  Heart attack.  Not being able to have children (infertility).  Weak bones (osteoporosis) and broken bones (fractures).  If you have coughing, wheezing, and shortness of breath, those symptoms may get better when you quit. You may also get sick less often. If you are pregnant, quitting smoking can help to lower your chances of having a baby of low birth weight. What can I do to help me quit smoking? Talk with your doctor about what can help you quit smoking. Some things you can do (strategies) include:  Quitting smoking totally, instead of slowly cutting back how much you smoke over a period of time.  Going to in-person counseling. You are more likely to quit if you go to many counseling sessions.  Using resources and support systems, such as: ? Online chats with a counselor. ? Phone quitlines. ? Printed self-help materials. ? Support groups or group counseling. ? Text messaging programs. ? Mobile phone apps or applications.  Taking medicines. Some of these medicines may have nicotine in them. If you are pregnant or breastfeeding, do not take any medicines to quit smoking unless your doctor says it is okay. Talk with your doctor about counseling or other things that can help you.  Talk with your doctor about using more than one strategy at the same time, such as taking medicines while you are also going to in-person counseling. This can help make  quitting easier. What things can I do to make it easier to quit? Quitting smoking might feel very hard at first, but there is a lot that you can do to make it easier. Take these steps:  Talk to your family and friends. Ask them to support and encourage you.  Call phone quitlines, reach out to support groups, or work with a counselor.  Ask people who smoke to not smoke around you.  Avoid places that make you want (trigger) to smoke, such as: ? Bars. ? Parties. ? Smoke-break areas at work.  Spend time with people who do not smoke.  Lower the stress in your life. Stress can make you want to smoke. Try these things to help your stress: ? Getting regular exercise. ? Deep-breathing exercises. ? Yoga. ? Meditating. ? Doing a body scan. To do this, close your eyes, focus on one area of your body at a time from head to toe, and notice which parts of your body are tense. Try to relax the muscles in those areas.  Download or buy apps on your mobile phone or tablet that can help you stick to your quit plan. There are many free apps, such as QuitGuide from the CDC (Centers for Disease Control and Prevention). You can find more support from smokefree.gov and other websites.  This information is not intended to replace advice given to you by your health care provider. Make sure you discuss any questions you have with your health care provider. Document Released: 12/18/2008 Document   Revised: 10/20/2015 Document Reviewed: 07/08/2014 Elsevier Interactive Patient Education  2018 Elsevier Inc.     Peripheral Vascular Disease Peripheral vascular disease (PVD) is a disease of the blood vessels that are not part of your heart and brain. A simple term for PVD is poor circulation. In most cases, PVD narrows the blood vessels that carry blood from your heart to the rest of your body. This can result in a decreased supply of blood to your arms, legs, and internal organs, like your stomach or kidneys.  However, it most often affects a person's lower legs and feet. There are two types of PVD.  Organic PVD. This is the more common type. It is caused by damage to the structure of blood vessels.  Functional PVD. This is caused by conditions that make blood vessels contract and tighten (spasm).  Without treatment, PVD tends to get worse over time. PVD can also lead to acute ischemic limb. This is when an arm or limb suddenly has trouble getting enough blood. This is a medical emergency. Follow these instructions at home:  Take medicines only as told by your doctor.  Do not use any tobacco products, including cigarettes, chewing tobacco, or electronic cigarettes. If you need help quitting, ask your doctor.  Lose weight if you are overweight, and maintain a healthy weight as told by your doctor.  Eat a diet that is low in fat and cholesterol. If you need help, ask your doctor.  Exercise regularly. Ask your doctor for some good activities for you.  Take good care of your feet. ? Wear comfortable shoes that fit well. ? Check your feet often for any cuts or sores. Contact a doctor if:  You have cramps in your legs while walking.  You have leg pain when you are at rest.  You have coldness in a leg or foot.  Your skin changes.  You are unable to get or have an erection (erectile dysfunction).  You have cuts or sores on your feet that are not healing. Get help right away if:  Your arm or leg turns cold and blue.  Your arms or legs become red, warm, swollen, painful, or numb.  You have chest pain or trouble breathing.  You suddenly have weakness in your face, arm, or leg.  You become very confused or you cannot speak.  You suddenly have a very bad headache.  You suddenly cannot see. This information is not intended to replace advice given to you by your health care provider. Make sure you discuss any questions you have with your health care provider. Document Released:  05/18/2009 Document Revised: 07/30/2015 Document Reviewed: 08/01/2013 Elsevier Interactive Patient Education  2017 Elsevier Inc.  

## 2016-11-30 ENCOUNTER — Other Ambulatory Visit: Payer: Self-pay | Admitting: Urology

## 2016-11-30 DIAGNOSIS — N2889 Other specified disorders of kidney and ureter: Secondary | ICD-10-CM

## 2016-12-01 NOTE — Addendum Note (Signed)
Addended by: Lianne Cure A on: 12/01/2016 04:21 PM   Modules accepted: Orders

## 2016-12-20 ENCOUNTER — Ambulatory Visit
Admission: RE | Admit: 2016-12-20 | Discharge: 2016-12-20 | Disposition: A | Discharge status: Home / Self Care | Payer: Medicare Other | Source: Ambulatory Visit | Attending: Urology | Admitting: Urology

## 2016-12-20 DIAGNOSIS — N2889 Other specified disorders of kidney and ureter: Secondary | ICD-10-CM

## 2016-12-20 DIAGNOSIS — C641 Malignant neoplasm of right kidney, except renal pelvis: Secondary | ICD-10-CM | POA: Diagnosis not present

## 2016-12-20 HISTORY — PX: IR RADIOLOGIST EVAL & MGMT: IMG5224

## 2016-12-20 NOTE — Consult Note (Signed)
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 male who was recently evaluated for abdominal pain in the emergency room. CT imaging performed. This demonstrated a incidental right renal mass.  This right renal mass measured 2.3 cm in the mid pole laterally within the cortex. Mass demonstrates solid components with enhancement and is compatible with a renal cell carcinoma. He remains asymptomatic. No significant abdominal pain, flank pain, hematuria or dysuria. No fevers. He is referred by urology to be evaluated for image guided cryoablation.   Past Medical History:  Diagnosis Date  . Arthritis   . Concussion    age 69  . Depression    history of years ago about 20 years ago  . Diabetes mellitus without complication ([place])    type 2  . GERD (gastroesophageal reflux disease)   . Hypertension   . Neuromuscular disorder (HCC)    neuropathy  . PAD (peripheral artery disease) ([place])   . PTSD (post-traumatic stress disorder)     Past Surgical History:  Procedure Laterality Date  . ANGIOPLASTY ILLIAC ARTERY  02/05/2016   Procedure: Right Balloon ANGIOPLASTY External  ILIAC ARTERY;  Surgeon: Rosetta Posner, MD;  Location: [place];  Service: Vascular;;  . APPENDECTOMY     ruptured  . ENDARTERECTOMY FEMORAL Right 02/05/2016   Procedure: Bilateral ENDARTERECTOMY FEMORAL;  Surgeon: Rosetta Posner, MD;  Location: [place];  Service: Vascular;  Laterality: Right;  . EYE SURGERY     cataracts removed  . FEMORAL-FEMORAL BYPASS GRAFT Bilateral 02/05/2016   Procedure: BYPASS GRAFT RIGHT FEMORAL-LEFT FEMORAL ARTERY;  Surgeon: Rosetta Posner, MD;  Location: [place];  Service: Vascular;  Laterality: Bilateral;  . INTRAOPERATIVE ARTERIOGRAM  02/05/2016   Procedure: INTRA OPERATIVE ARTERIOGRAM;  Surgeon: Rosetta Posner, MD;  Location: [place];  Service: Vascular;;  . PERIPHERAL VASCULAR CATHETERIZATION N/A  11/16/2015   Procedure: Abdominal Aortogram w/Lower Extremity;  Surgeon: Angelia Mould, MD;  Location: [place] [facility];  Service: Cardiovascular;  Laterality: N/A;  . REPLACEMENT TOTAL KNEE Left     Allergies: Patient has no known allergies.  Medications: Prior to Admission medications   Medication Sig Start Date End Date Taking? Authorizing Provider  albuterol-ipratropium (COMBIVENT) 18-103 MCG/ACT inhaler Inhale 2 puffs into the lungs 2 (two) times daily.   Yes [provider]  Ascorbic Acid (VITAMIN C PLUS WILD ROSE HIPS PO) Take 1,000 mg by mouth daily.   Yes [provider]  Aspirin-Acetaminophen-Caffeine (EXCEDRIN PO) Take 1 tablet by mouth as needed (PAIN).   Yes [provider]  atorvastatin (LIPITOR) 80 MG tablet Take 80 mg by mouth daily.   Yes [provider]  gabapentin (NEURONTIN) 300 MG capsule Take 300 mg by mouth 4 (four) times daily.    Yes [provider]  lisinopril-hydrochlorothiazide (PRINZIDE,ZESTORETIC) 20-25 MG tablet Take 1 tablet by mouth daily.   Yes [provider]  metFORMIN (GLUCOPHAGE) 1000 MG tablet Take 1,000 mg by mouth 2 (two) times daily with a meal.   Yes [provider]  Multiple Vitamin (MULTIVITAMIN WITH MINERALS) TABS tablet Take 1 tablet by mouth daily.   Yes [provider]  omeprazole (PRILOSEC) 20 MG capsule Take 20 mg by mouth daily.   Yes [provider]  OVER THE COUNTER MEDICATION Take 1 capsule by mouth daily. Triple 3 Omega with Flaxseed oil and Olive oils  Yes [provider]  polyvinyl alcohol (LIQUIFILM TEARS) 1.4 % ophthalmic solution Place 1 drop into both eyes as needed for dry eyes.    Yes [provider]  sildenafil (VIAGRA) 100 MG tablet Take 100 mg by mouth daily as needed for erectile dysfunction.   Yes [provider]  clopidogrel (PLAVIX) 75 MG tablet Take 1 tablet (75 mg total) by mouth daily. Patient not  taking: Reported on 12/20/2016 02/07/16   Ulyses Amor, PA-C  glipiZIDE (GLUCOTROL) 10 MG tablet Take 10 mg by mouth 2 (two) times daily before a meal.    [provider]  oxyCODONE-acetaminophen (PERCOCET/ROXICET) 5-325 MG tablet Take 1-2 tablets by mouth every 4 (four) hours as needed for moderate pain. Patient not taking: Reported on 12/20/2016 02/07/16   Ulyses Amor, PA-C     No family history on file.  Social History   Social History  . Marital status: Single    Spouse name: N/A  . Number of children: N/A  . Years of education: N/A   Social History Main Topics  . Smoking status: Current Every Day Smoker    Packs/day: 0.20    Years: 40.00    Types: Cigarettes  . Smokeless tobacco: Never Used     Comment: 2 pks per day  . Alcohol use No  . Drug use: No  . Sexual activity: Not on file   Other Topics Concern  . Not on file   Social History Narrative  . No narrative on file    ECOG Status: 0 - Asymptomatic  Review of Systems: A 12 point ROS discussed and pertinent positives are indicated in the HPI above.  All other systems are negative.  Review of Systems  Vital Signs: BP (!) 124/57   Pulse 80   Temp 98.4 F (36.9 C) (Oral)   Resp 16   Ht 6' (1.829 m)   Wt 187 lb (84.8 kg)   SpO2 98%   BMI 25.36 kg/m   Physical Exam  Constitutional: He is oriented to person, place, and time. He appears well-developed and well-nourished. No distress.  Patient walks with a cane.  Eyes: Conjunctivae are normal. No scleral icterus.  Cardiovascular: Normal rate, regular rhythm and normal heart sounds.   No murmur heard. Pulmonary/Chest: Effort normal. He has wheezes. He has rales.  Abdominal: Soft. Bowel sounds are normal. He exhibits no distension and no mass. There is no tenderness.  Musculoskeletal: Normal range of motion. He exhibits no edema or tenderness.  Neurological: He is alert and oriented to person, place, and time.  Skin: He is not diaphoretic.    Psychiatric: He has a normal mood and affect. His behavior is normal.    Imaging: No results found.  Labs:  CBC:  Recent Labs  02/02/16 1135 02/05/16 1930 02/06/16 0404  WBC 6.6 12.4* 11.0*  HGB 11.8* 9.3* 8.6*  HCT 35.8* 29.1* 27.0*  PLT 361 268 253    COAGS:  Recent Labs  02/02/16 1135  INR 1.08  APTT 28    BMP:  Recent Labs  02/02/16 1135 02/05/16 1930 02/06/16 0404  NA 131*  --  134*  K 3.9  --  4.2  CL 95*  --  98*  CO2 26  --  26  GLUCOSE 201*  --  221*  BUN 14  --  13  CALCIUM 9.7  --  8.7*  CREATININE 1.15 1.19 1.21  GFRNONAA >60 >60 59*  GFRAA >60 >60 >60  LIVER FUNCTION TESTS:  Recent Labs  02/02/16 1135  BILITOT 0.7  AST 18  ALT 20  ALKPHOS 68  PROT 7.3  ALBUMIN 4.2    Assessment and Plan:  2.3 cm lateral right renal mass compatible with a renal cell carcinoma by CT imaging.   lesion location and size is amenable to percutaneous image guided cryoablation under general anesthesia. The procedure, risks, benefits and alternatives were reviewed. Expected goals, recovery, outcomes and surveillance were also reviewed. All questions were addressed. Imaging was reviewed with the patient and his family member on the computer today. He has a clear understanding of the procedure. After our discussion he would like to proceed with scheduling the cryoablation electively.  Plan: Schedule for elective outpatient right renal cell carcinoma CT-guided cry[facility]ablation at [facility] in the next few weeks.  Thank you for this interesting consult.  I greatly enjoyed meeting Brian Pratt and look forward to participating in their care.  A copy of this report was sent to the requesting provider on this date.  Electronically Signed: [place] Keen 12/20/2016, 4:17 PM   I spent a total of  40 Minutes   in face to face in clinical consultation, greater than 50% of which was counseling/coordinating care for this patient with a right renal cell  carcinoma.

## 2016-12-27 ENCOUNTER — Other Ambulatory Visit (HOSPITAL_COMMUNITY): Payer: Self-pay | Admitting: Interventional Radiology

## 2016-12-27 DIAGNOSIS — N2889 Other specified disorders of kidney and ureter: Secondary | ICD-10-CM

## 2017-01-04 ENCOUNTER — Encounter: Payer: Self-pay | Admitting: Interventional Radiology

## 2017-01-04 ENCOUNTER — Other Ambulatory Visit: Payer: Self-pay | Admitting: Urology

## 2017-02-08 ENCOUNTER — Other Ambulatory Visit: Payer: Self-pay | Admitting: Radiology

## 2017-02-10 NOTE — Patient Instructions (Addendum)
Brian Pratt  02/10/2017   Your procedure is scheduled on: 02-15-17   Report to [facility] Main  Entrance Take [employer] Elevators to 3rd floor to  [place] at 6:30 AM.   Call this number if you have problems the morning of surgery [phone]    Remember: ONLY 1 PERSON MAY GO WITH YOU TO SHORT STAY TO GET  READY MORNING OF YOUR SURGERY.  Do not eat food or drink liquids :After Midnight.     Take these medicines the morning of surgery with A SIP OF WATER: Gabapentin (Neurontin), and Omeprazole (Prilosec). You may also bring and use your inhaler and eyedrops as needed.                                You may not have any metal on your body including hair pins and              piercings  Do not wear jewelry, lotions, powders or deodorant             Men may shave face and neck.   Do not bring valuables to the hospital. [place].  Contacts, dentures or bridgework may not be worn into surgery.  Leave suitcase in the car. After surgery it may be brought to your room.               Please read over the following fact sheets you were given: _____________________________________________________________________  How to Manage Your Diabetes Before and After Surgery  Why is it important to control my blood sugar before and after surgery? . Improving blood sugar levels before and after surgery helps healing and can limit problems. . A way of improving blood sugar control is eating a healthy diet by: o  Eating less sugar and carbohydrates o  Increasing activity/exercise o  Talking with your doctor about reaching your blood sugar goals . High blood sugars (greater than 180 mg/dL) can raise your risk of infections and slow your recovery, so you will need to focus on controlling your diabetes during the weeks before surgery. . Make sure that the doctor who takes care of your diabetes knows about your planned  surgery including the date and location.  How do I manage my blood sugar before surgery? . Check your blood sugar at least 4 times a day, starting 2 days before surgery, to make sure that the level is not too high or low. o Check your blood sugar the morning of your surgery when you wake up and every 2 hours until you get to the Short Stay unit. . If your blood sugar is less than 70 mg/dL, you will need to treat for low blood sugar: o Do not take insulin. o Treat a low blood sugar (less than 70 mg/dL) with  cup of clear juice (cranberry or apple), 4 glucose tablets, OR glucose gel. o Recheck blood sugar in 15 minutes after treatment (to make sure it is greater than 70 mg/dL). If your blood sugar is not greater than 70 mg/dL on recheck, call [phone] for further instructions. . Report your blood sugar to the short stay nurse when you get to Short Stay.  . If you are admitted to the  hospital after surgery: o Your blood sugar will be checked by the staff and you will probably be given insulin after surgery (instead of oral diabetes medicines) to make sure you have good blood sugar levels. o The goal for blood sugar control after surgery is 80-180 mg/dL.   WHAT DO I DO ABOUT MY DIABETES MEDICATION?  Marland Kitchen Do not take oral diabetes medicines (pills) the morning of surgery.  . THE DAY BEFORE SURGERY, take only 35 units of your 70/30 insulin twice on 02-14-17. You can take your usual dose of Metformin      Patient Signature:  Date:   Nurse Signature:  Date:   Reviewed and Endorsed by [facility] Patient Education Committee, August 2015           [facility] - Preparing for Surgery Before surgery, you can play an important role.  Because skin is not sterile, your skin needs to be as free of germs as possible.  You can reduce the number of germs on your skin by washing with CHG (chlorahexidine gluconate) soap before surgery.  CHG is an antiseptic cleaner which kills germs and bonds with the  skin to continue killing germs even after washing. Please DO NOT use if you have an allergy to CHG or antibacterial soaps.  If your skin becomes reddened/irritated stop using the CHG and inform your nurse when you arrive at Short Stay. Do not shave (including legs and underarms) for at least 48 hours prior to the first CHG shower.  You may shave your face/neck. Please follow these instructions carefully:  1.  Shower with CHG Soap the night before surgery and the  morning of Surgery.  2.  If you choose to wash your hair, wash your hair first as usual with your  normal  shampoo.  3.  After you shampoo, rinse your hair and body thoroughly to remove the  shampoo.                           4.  Use CHG as you would any other liquid soap.  You can apply chg directly  to the skin and wash                       Gently with a scrungie or clean washcloth.  5.  Apply the CHG Soap to your body ONLY FROM THE NECK DOWN.   Do not use on face/ open                           Wound or open sores. Avoid contact with eyes, ears mouth and genitals (private parts).                       Wash face,  Genitals (private parts) with your normal soap.             6.  Wash thoroughly, paying special attention to the area where your surgery  will be performed.  7.  Thoroughly rinse your body with warm water from the neck down.  8.  DO NOT shower/wash with your normal soap after using and rinsing off  the CHG Soap.                9.  Pat yourself dry with a clean towel.            10.  Wear clean  pajamas.            11.  Place clean sheets on your bed the night of your first shower and do not  sleep with pets. Day of Surgery : Do not apply any lotions/deodorants the morning of surgery.  Please wear clean clothes to the hospital/surgery center.  FAILURE TO FOLLOW THESE INSTRUCTIONS MAY RESULT IN THE CANCELLATION OF YOUR SURGERY PATIENT SIGNATURE_________________________________  NURSE  SIGNATURE__________________________________  ________________________________________________________________________  WHAT IS A BLOOD TRANSFUSION? Blood Transfusion Information  A transfusion is the replacement of blood or some of its parts. Blood is made up of multiple cells which provide different functions.  Red blood cells carry oxygen and are used for blood loss replacement.  White blood cells fight against infection.  Platelets control bleeding.  Plasma helps clot blood.  Other blood products are available for specialized needs, such as hemophilia or other clotting disorders. BEFORE THE TRANSFUSION  Who gives blood for transfusions?   Healthy volunteers who are fully evaluated to make sure their blood is safe. This is blood bank blood. Transfusion therapy is the safest it has ever been in the practice of medicine. Before blood is taken from a donor, a complete history is taken to make sure that person has no history of diseases nor engages in risky social behavior (examples are intravenous drug use or sexual activity with multiple partners). The donor's travel history is screened to minimize risk of transmitting infections, such as malaria. The donated blood is tested for signs of infectious diseases, such as HIV and hepatitis. The blood is then tested to be sure it is compatible with you in order to minimize the chance of a transfusion reaction. If you or a relative donates blood, this is often done in anticipation of surgery and is not appropriate for emergency situations. It takes many days to process the donated blood. RISKS AND COMPLICATIONS Although transfusion therapy is very safe and saves many lives, the main dangers of transfusion include:   Getting an infectious disease.  Developing a transfusion reaction. This is an allergic reaction to something in the blood you were given. Every precaution is taken to prevent this. The decision to have a blood transfusion has been  considered carefully by your caregiver before blood is given. Blood is not given unless the benefits outweigh the risks. AFTER THE TRANSFUSION  Right after receiving a blood transfusion, you will usually feel much better and more energetic. This is especially true if your red blood cells have gotten low (anemic). The transfusion raises the level of the red blood cells which carry oxygen, and this usually causes an energy increase.  The nurse administering the transfusion will monitor you carefully for complications. HOME CARE INSTRUCTIONS  No special instructions are needed after a transfusion. You may find your energy is better. Speak with your caregiver about any limitations on activity for underlying diseases you may have. SEEK MEDICAL CARE IF:   Your condition is not improving after your transfusion.  You develop redness or irritation at the intravenous (IV) site. SEEK IMMEDIATE MEDICAL CARE IF:  Any of the following symptoms occur over the next 12 hours:  Shaking chills.  You have a temperature by mouth above 102 F (38.9 C), not controlled by medicine.  Chest, back, or muscle pain.  People around you feel you are not acting correctly or are confused.  Shortness of breath or difficulty breathing.  Dizziness and fainting.  You get a rash or develop hives.  You have  a decrease in urine output.  Your urine turns a dark color or changes to pink, red, or brown. Any of the following symptoms occur over the next 10 days:  You have a temperature by mouth above 102 F (38.9 C), not controlled by medicine.  Shortness of breath.  Weakness after normal activity.  The white part of the eye turns yellow (jaundice).  You have a decrease in the amount of urine or are urinating less often.  Your urine turns a dark color or changes to pink, red, or brown. Document Released: 02/19/2000 Document Revised: 05/16/2011 Document Reviewed: 10/08/2007 [facility] Patient Information 2014  [place], [place].  _______________________________________________________________________

## 2017-02-13 ENCOUNTER — Encounter (HOSPITAL_COMMUNITY)
Admission: RE | Admit: 2017-02-13 | Discharge: 2017-02-13 | Disposition: A | Discharge status: Home / Self Care | Payer: Medicare Other | Source: Ambulatory Visit | Attending: Interventional Radiology | Admitting: Interventional Radiology

## 2017-02-13 ENCOUNTER — Encounter (HOSPITAL_COMMUNITY): Payer: Self-pay

## 2017-02-13 ENCOUNTER — Other Ambulatory Visit: Payer: Self-pay

## 2017-02-13 ENCOUNTER — Other Ambulatory Visit: Payer: Self-pay | Admitting: Radiology

## 2017-02-13 NOTE — Progress Notes (Signed)
04-15-16 (Epic) EKG

## 2017-02-14 ENCOUNTER — Inpatient Hospital Stay (HOSPITAL_COMMUNITY): Admission: RE | Admit: 2017-02-14 | Payer: Medicare Other | Source: Ambulatory Visit

## 2017-02-15 ENCOUNTER — Ambulatory Visit (HOSPITAL_COMMUNITY): Payer: Medicare Other

## 2017-02-24 ENCOUNTER — Encounter (HOSPITAL_COMMUNITY)
Admission: RE | Admit: 2017-02-24 | Discharge: 2017-02-24 | Disposition: A | Discharge status: Home / Self Care | Payer: Medicare Other | Source: Ambulatory Visit | Attending: Interventional Radiology | Admitting: Interventional Radiology

## 2017-02-24 ENCOUNTER — Ambulatory Visit (HOSPITAL_COMMUNITY): Payer: Medicare Other | Admitting: Anesthesiology

## 2017-02-24 ENCOUNTER — Other Ambulatory Visit: Payer: Self-pay

## 2017-02-24 ENCOUNTER — Other Ambulatory Visit: Payer: Self-pay | Admitting: Radiology

## 2017-02-24 ENCOUNTER — Ambulatory Visit (HOSPITAL_COMMUNITY)
Admission: RE | Admit: 2017-02-24 | Discharge: 2017-02-24 | Disposition: A | Discharge status: Home / Self Care | Payer: Medicare Other | Source: Ambulatory Visit | Attending: Radiology | Admitting: Radiology

## 2017-02-24 ENCOUNTER — Other Ambulatory Visit (HOSPITAL_COMMUNITY): Payer: Self-pay | Admitting: Interventional Radiology

## 2017-02-24 ENCOUNTER — Encounter (HOSPITAL_COMMUNITY): Payer: Self-pay

## 2017-02-24 DIAGNOSIS — I700 Atherosclerosis of aorta: Secondary | ICD-10-CM | POA: Diagnosis not present

## 2017-02-24 DIAGNOSIS — N2889 Other specified disorders of kidney and ureter: Secondary | ICD-10-CM | POA: Diagnosis not present

## 2017-02-24 DIAGNOSIS — Z0183 Encounter for blood typing: Secondary | ICD-10-CM | POA: Diagnosis not present

## 2017-02-24 DIAGNOSIS — Z01818 Encounter for other preprocedural examination: Secondary | ICD-10-CM | POA: Insufficient documentation

## 2017-02-24 DIAGNOSIS — Z01812 Encounter for preprocedural laboratory examination: Secondary | ICD-10-CM | POA: Diagnosis not present

## 2017-02-24 DIAGNOSIS — R109 Unspecified abdominal pain: Secondary | ICD-10-CM | POA: Diagnosis not present

## 2017-02-24 HISTORY — DX: Other complications of anesthesia, initial encounter: T88.59XA

## 2017-02-24 HISTORY — DX: Adverse effect of unspecified anesthetic, initial encounter: T41.45XA

## 2017-02-24 LAB — CBC WITH DIFFERENTIAL/PLATELET
BASOS ABS: 0.1 10*3/uL (ref 0.0–0.1)
BASOS PCT: 1 %
EOS PCT: 1 %
Eosinophils Absolute: 0.1 10*3/uL (ref 0.0–0.7)
HEMATOCRIT: 26.6 % — AB (ref 39.0–52.0)
HEMOGLOBIN: 7.7 g/dL — AB (ref 13.0–17.0)
LYMPHS ABS: 2.4 10*3/uL (ref 0.7–4.0)
LYMPHS PCT: 35 %
MCH: 20.7 pg — AB (ref 26.0–34.0)
MCHC: 28.9 g/dL — AB (ref 30.0–36.0)
MCV: 71.5 fL — AB (ref 78.0–100.0)
MONOS PCT: 7 %
Monocytes Absolute: 0.5 10*3/uL (ref 0.1–1.0)
NEUTROS ABS: 3.8 10*3/uL (ref 1.7–7.7)
Neutrophils Relative %: 56 %
Platelets: 385 10*3/uL (ref 150–400)
RBC: 3.72 MIL/uL — ABNORMAL LOW (ref 4.22–5.81)
RDW: 17.2 % — AB (ref 11.5–15.5)
WBC: 6.9 10*3/uL (ref 4.0–10.5)

## 2017-02-24 LAB — BASIC METABOLIC PANEL
ANION GAP: 9 (ref 5–15)
BUN: 24 mg/dL — AB (ref 6–20)
CO2: 26 mmol/L (ref 22–32)
Calcium: 9 mg/dL (ref 8.9–10.3)
Chloride: 97 mmol/L — ABNORMAL LOW (ref 101–111)
Creatinine, Ser: 1.04 mg/dL (ref 0.61–1.24)
GFR calc Af Amer: >60 mL/min (ref >60)
GLUCOSE: 239 mg/dL — AB (ref 65–99)
POTASSIUM: 4.9 mmol/L (ref 3.5–5.1)
Sodium: 132 mmol/L — ABNORMAL LOW (ref 135–145)

## 2017-02-24 LAB — HEMOGLOBIN A1C
Hgb A1c MFr Bld: 9.4 % — ABNORMAL HIGH (ref 4.8–5.6)
MEAN PLASMA GLUCOSE: 223.08 mg/dL

## 2017-02-24 LAB — ABO/RH: ABO/RH(D): O POS

## 2017-02-24 LAB — GLUCOSE, CAPILLARY: GLUCOSE-CAPILLARY: 244 mg/dL — AB (ref 65–99)

## 2017-02-24 LAB — PROTIME-INR
INR: 1.14
Prothrombin Time: 14.5 seconds (ref 11.4–15.2)

## 2017-02-24 NOTE — Progress Notes (Signed)
Anesthesiology Note:  Elonzo Favorite is a 71 year old male with right renal mass  scheduled to undergo cryo-ablation by the radiology service on 03/01/2017. He has a history of hypertension, smoking, and poorly controlled type 2 diabetes . He underwent bilateral femoral endarterectomies and right to left femorofemoral bypass by Dr. Donnetta Hutching on 02/05/2016. He noted that he was confused for several days after surgery.  No documented history of heart disease and does not see a cardiologist.  He denies chest pain stroke shortness of breath or history of kidney disease. He has been noted to have peripheral neuropathy from diabetes.  Labs notable for for glucose 223, Hgb A1C 9.4 BUN/Cr. 24/1.04 H/H- 7.7/26.6  I discussed the planned procedure which will be done under general endotracheal anesthesia. Anesthetic plan was discussed and all questions were answered. He appears  to be an acceptable candidate for surgery at this time.  Roberts Gaudy

## 2017-02-24 NOTE — Patient Instructions (Addendum)
ADRAIN NESBIT  02/24/2017   Your procedure is scheduled on: 03-01-17  Report to [facility] Main  Entrance Take [place]  elevators to 3rd floor to  [place] at 1030 AM.   Call this number if you have problems the morning of surgery [phone]  Remember: ONLY 1 PERSON MAY GO WITH YOU TO SHORT STAY TO GET  READY MORNING OF YOUR SURGERY.  Do not eat food or drink liquids :After Midnight.     Take these medicines the morning of surgery with A SIP OF WATER: albuterol inhaler if needed and bring inhaler, eye drop if needed omeprazole (prilosec), GABAPENTIN, (NEURONTIN) DO NOT TAKE ANY DIABETIC MEDICATIONS DAY OF YOUR SURGERY                               You may not have any metal on your body including hair pins and              piercings  Do not wear jewelry, make-up, lotions, powders or perfumes, deodorant                         Men may shave face and neck.   Do not bring valuables to the hospital. [place].  Contacts, dentures or bridgework may not be worn into surgery.  Leave suitcase in the car. After surgery it may be brought to your room.                  Please read over the following fact sheets you were given: _____________________________________________________________________             How to Manage Your Diabetes Before and After Surgery  Why is it important to control my blood sugar before and after surgery? . Improving blood sugar levels before and after surgery helps healing and can limit problems. . A way of improving blood sugar control is eating a healthy diet by: o  Eating less sugar and carbohydrates o  Increasing activity/exercise o  Talking with your doctor about reaching your blood sugar goals . High blood sugars (greater than 180 mg/dL) can raise your risk of infections and slow your recovery, so you will need to focus on controlling your diabetes during the weeks before  surgery. . Make sure that the doctor who takes care of your diabetes knows about your planned surgery including the date and location.  How do I manage my blood sugar before surgery? . Check your blood sugar at least 4 times a day, starting 2 days before surgery, to make sure that the level is not too high or low. o Check your blood sugar the morning of your surgery when you wake up and every 2 hours until you get to the Short Stay unit. . If your blood sugar is less than 70 mg/dL, you will need to treat for low blood sugar: o Do not take insulin. o Treat a low blood sugar (less than 70 mg/dL) with  cup of clear juice (cranberry or apple), 4 glucose tablets, OR glucose gel. o Recheck blood sugar in 15 minutes after treatment (to make sure it is greater than 70 mg/dL). If your blood sugar is not greater than  70 mg/dL on recheck, call [phone] for further instructions. . Report your blood sugar to the short stay nurse when you get to Short Stay.  . If you are admitted to the hospital after surgery: o Your blood sugar will be checked by the staff and you will probably be given insulin after surgery (instead of oral diabetes medicines) to make sure you have good blood sugar levels. o The goal for blood sugar control after surgery is 80-180 mg/dL.   WHAT DO I DO ABOUT MY DIABETES MEDICATION?  .  TAKE YOUR MET FORMIN AS USUAL THE DAY BEFORE SURGERY ON 02-28-17, DO NOT TAKE YOUR METFORMIN ON MORNING OF SURGERY 03-01-17..  . THE NIGHT BEFORE SURGERY 02-28-17  TAKE 35 UNITS OF NOVOLOG 70/30 INSULIN      . THE MORNING OF SURGERY, 03-01-17, DO NOT TAKE ANY NOVOLOG 70/30 INSULIN  .   Marland Kitchen       [place] - Preparing for Surgery Before surgery, you can play an important role.  Because skin is not sterile, your skin needs to be as free of germs as possible.  You can reduce the number of germs on your skin by washing with CHG (chlorahexidine gluconate) soap before surgery.  CHG is an antiseptic  cleaner which kills germs and bonds with the skin to continue killing germs even after washing. Please DO NOT use if you have an allergy to CHG or antibacterial soaps.  If your skin becomes reddened/irritated stop using the CHG and inform your nurse when you arrive at Short Stay. Do not shave (including legs and underarms) for at least 48 hours prior to the first CHG shower.  You may shave your face/neck. Please follow these instructions carefully:  1.  Shower with CHG Soap the night before surgery and the  morning of Surgery.  2.  If you choose to wash your hair, wash your hair first as usual with your  normal  shampoo.  3.  After you shampoo, rinse your hair and body thoroughly to remove the  shampoo.                           4.  Use CHG as you would any other liquid soap.  You can apply chg directly  to the skin and wash                       Gently with a scrungie or clean washcloth.  5.  Apply the CHG Soap to your body ONLY FROM THE NECK DOWN.   Do not use on face/ open                           Wound or open sores. Avoid contact with eyes, ears mouth and genitals (private parts).                       Wash face,  Genitals (private parts) with your normal soap.             6.  Wash thoroughly, paying special attention to the area where your surgery  will be performed.  7.  Thoroughly rinse your body with warm water from the neck down.  8.  DO NOT shower/wash with your normal soap after using and rinsing off  the CHG Soap.  9.  Pat yourself dry with a clean towel.            10.  Wear clean pajamas.            11.  Place clean sheets on your bed the night of your first shower and do not  sleep with pets. Day of Surgery : Do not apply any lotions/deodorants the morning of surgery.  Please wear clean clothes to the hospital/surgery center.  FAILURE TO FOLLOW THESE INSTRUCTIONS MAY RESULT IN THE CANCELLATION OF YOUR SURGERY PATIENT  SIGNATURE_________________________________  NURSE SIGNATURE__________________________________  ________________________________________________________________________

## 2017-02-24 NOTE — Progress Notes (Signed)
Spoke with kevin allred pa and he is aware of hemaglobin 7.7 with pre op labs and routed hemaglobin a1c results to kevin allred pa via epic

## 2017-02-24 NOTE — Progress Notes (Signed)
Dr Linna Caprice anesthesia saw patient for pre op consultaion ekg 04-15-16 va on chart

## 2017-02-24 NOTE — Progress Notes (Signed)
PLEASE CHANGE BOOKING T0 TREVOR SCHICK OR CONSENT TO MICHAEL SCHICK PLEASE. THANKS

## 2017-02-27 ENCOUNTER — Other Ambulatory Visit: Payer: Self-pay | Admitting: Radiology

## 2017-03-01 ENCOUNTER — Other Ambulatory Visit: Payer: Self-pay

## 2017-03-01 ENCOUNTER — Encounter (HOSPITAL_COMMUNITY)
Admission: RE | Disposition: A | Discharge status: Home / Self Care | Payer: Self-pay | Source: Ambulatory Visit | Attending: Interventional Radiology

## 2017-03-01 ENCOUNTER — Ambulatory Visit (HOSPITAL_COMMUNITY)
Admission: RE | Admit: 2017-03-01 | Discharge: 2017-03-01 | Disposition: A | Discharge status: Home / Self Care | Payer: Medicare Other | Source: Ambulatory Visit | Attending: Interventional Radiology | Admitting: Interventional Radiology

## 2017-03-01 ENCOUNTER — Encounter (HOSPITAL_COMMUNITY): Payer: Self-pay | Admitting: General Practice

## 2017-03-01 DIAGNOSIS — R05 Cough: Secondary | ICD-10-CM | POA: Insufficient documentation

## 2017-03-01 DIAGNOSIS — I10 Essential (primary) hypertension: Secondary | ICD-10-CM | POA: Insufficient documentation

## 2017-03-01 DIAGNOSIS — Z794 Long term (current) use of insulin: Secondary | ICD-10-CM | POA: Diagnosis not present

## 2017-03-01 DIAGNOSIS — Z538 Procedure and treatment not carried out for other reasons: Secondary | ICD-10-CM | POA: Diagnosis not present

## 2017-03-01 DIAGNOSIS — E1151 Type 2 diabetes mellitus with diabetic peripheral angiopathy without gangrene: Secondary | ICD-10-CM | POA: Diagnosis not present

## 2017-03-01 DIAGNOSIS — Z96652 Presence of left artificial knee joint: Secondary | ICD-10-CM | POA: Insufficient documentation

## 2017-03-01 DIAGNOSIS — F4310 Post-traumatic stress disorder, unspecified: Secondary | ICD-10-CM | POA: Insufficient documentation

## 2017-03-01 DIAGNOSIS — I4892 Unspecified atrial flutter: Secondary | ICD-10-CM | POA: Diagnosis not present

## 2017-03-01 DIAGNOSIS — Z87891 Personal history of nicotine dependence: Secondary | ICD-10-CM | POA: Insufficient documentation

## 2017-03-01 DIAGNOSIS — Z7984 Long term (current) use of oral hypoglycemic drugs: Secondary | ICD-10-CM | POA: Diagnosis not present

## 2017-03-01 DIAGNOSIS — E1140 Type 2 diabetes mellitus with diabetic neuropathy, unspecified: Secondary | ICD-10-CM | POA: Diagnosis not present

## 2017-03-01 DIAGNOSIS — M1990 Unspecified osteoarthritis, unspecified site: Secondary | ICD-10-CM | POA: Diagnosis not present

## 2017-03-01 DIAGNOSIS — I440 Atrioventricular block, first degree: Secondary | ICD-10-CM | POA: Insufficient documentation

## 2017-03-01 DIAGNOSIS — Z79899 Other long term (current) drug therapy: Secondary | ICD-10-CM | POA: Diagnosis not present

## 2017-03-01 DIAGNOSIS — K219 Gastro-esophageal reflux disease without esophagitis: Secondary | ICD-10-CM | POA: Insufficient documentation

## 2017-03-01 DIAGNOSIS — N2889 Other specified disorders of kidney and ureter: Secondary | ICD-10-CM | POA: Insufficient documentation

## 2017-03-01 LAB — CBC WITH DIFFERENTIAL/PLATELET
BASOS ABS: 0.1 10*3/uL (ref 0.0–0.1)
Basophils Relative: 1 %
EOS ABS: 0.1 10*3/uL (ref 0.0–0.7)
Eosinophils Relative: 1 %
HCT: 28.4 % — ABNORMAL LOW (ref 39.0–52.0)
Hemoglobin: 8.5 g/dL — ABNORMAL LOW (ref 13.0–17.0)
LYMPHS ABS: 2.3 10*3/uL (ref 0.7–4.0)
Lymphocytes Relative: 29 %
MCH: 21.5 pg — ABNORMAL LOW (ref 26.0–34.0)
MCHC: 29.9 g/dL — AB (ref 30.0–36.0)
MCV: 71.7 fL — ABNORMAL LOW (ref 78.0–100.0)
MONO ABS: 0.4 10*3/uL (ref 0.1–1.0)
Monocytes Relative: 5 %
NEUTROS ABS: 4.9 10*3/uL (ref 1.7–7.7)
Neutrophils Relative %: 64 %
PLATELETS: 225 10*3/uL (ref 150–400)
RBC: 3.96 MIL/uL — ABNORMAL LOW (ref 4.22–5.81)
RDW: 17 % — AB (ref 11.5–15.5)
WBC: 7.8 10*3/uL (ref 4.0–10.5)

## 2017-03-01 LAB — BASIC METABOLIC PANEL
Anion gap: 10 (ref 5–15)
BUN: 20 mg/dL (ref 6–20)
CALCIUM: 8.9 mg/dL (ref 8.9–10.3)
CO2: 23 mmol/L (ref 22–32)
Chloride: 96 mmol/L — ABNORMAL LOW (ref 101–111)
Creatinine, Ser: 1.14 mg/dL (ref 0.61–1.24)
GFR calc Af Amer: >60 mL/min (ref >60)
GFR calc non Af Amer: >60 mL/min (ref >60)
GLUCOSE: 272 mg/dL — AB (ref 65–99)
Potassium: 4.1 mmol/L (ref 3.5–5.1)
SODIUM: 129 mmol/L — AB (ref 135–145)

## 2017-03-01 LAB — TYPE AND SCREEN
ABO/RH(D): O POS
Antibody Screen: NEGATIVE

## 2017-03-01 LAB — GLUCOSE, CAPILLARY: GLUCOSE-CAPILLARY: 288 mg/dL — AB (ref 65–99)

## 2017-03-01 SURGERY — RADIO FREQUENCY ABLATION
Anesthesia: General | Laterality: Right

## 2017-03-01 MED ORDER — CEFAZOLIN SODIUM-DEXTROSE 2-4 GM/100ML-% IV SOLN
2.0000 g | INTRAVENOUS | Status: DC
  Filled 2017-03-01: qty 100

## 2017-03-01 MED ORDER — LACTATED RINGERS IV SOLN
INTRAVENOUS | Status: DC
  Administered 2017-03-01: 11:00:00 via INTRAVENOUS

## 2017-03-01 MED ORDER — FENTANYL CITRATE (PF) 250 MCG/5ML IJ SOLN
INTRAMUSCULAR | Status: AC
  Filled 2017-03-01: qty 5

## 2017-03-01 NOTE — Progress Notes (Signed)
Discharged to home, via w/c to car, accompanied by sister and other family members.  To return to Cardiologist in am for clearance workup prior to rescheduling IR procedure.

## 2017-03-01 NOTE — Discharge Summary (Signed)
Discharge note: Patient presented to IR department today for CT-guided right renal mass biopsy and cryoablation.  He was previously seen in consultation by Dr. Annamaria Boots on 12/20/16 and deemed an appropriate candidate for the above procedure.  He was  also evaluated by Dr. Linna Caprice  from anesthesiology on 12/21.  On today's EKG patient was noted to have a flutter with variable AV block.  Review of tracing from 2017 revealed sinus rhythm with first-degree AV block with PACs.  Patient is currently asymptomatic. He is currently not followed by cardiologist.  He was evaluated again by anesthesia today and they recommended cardiac clearance before above procedure.  Above findings were discussed with Dr. Earleen Newport and call made to cardiology.  Patient is scheduled to follow-up with Dr. Stanford Breed tomorrow morning for further evaluation.  IR service will contact patient following cardiology evaluation to reschedule procedure if cardiac clearance given.  Above discussed with patient and family.

## 2017-03-01 NOTE — Progress Notes (Signed)
Patient is received to 1st floor short stay to complete  preparations for scheduled IR procedure.  12 lead Ekg on chart shows ? 2nd degree heart block in 2017.  No additional cardiac information found.  Placed on cardiac monitor, shows atrial flutter rate 50's/60's.  Dr. Ermalene Postin, anesthesiologist notified.  New 12 lead EKG performed.  Decision made to cancel surgery pending cardiac evaluation.  Dr. Earleen Newport in to see patient , discussed situation with patient and family.  Dr. Ermalene Postin has contacted cardiology group to see pt.  Awaiting return call to decide disposition and further instructions for patient's treatment followup.

## 2017-03-01 NOTE — H&P (Signed)
Referring Physician(s): Chao,R  Supervising Physician: Corrie Mckusick  Patient Status:  [facility] OP TBA  Chief Complaint: Right renal mass   Subjective: Pt familiar to IR service from prior consultation with Dr. Annamaria Boots on 12/20/16 to discuss treatment options for incidentally noted 2.3 cm right renal mass consistent with renal cell carcinoma.  He was deemed an appropriate candidate for CT-guided cryoablation and biopsy of the mass and presents today for the procedure. He currently denies fever,HA,CP ,dyspnea, abdominal pain, N/V , dysuria or bleeding. He does have occasional cough , right hip discomfort and left lower extremity neuropathy.  He recently quit smoking. Past Medical History:  Diagnosis Date  . Arthritis   . Complication of anesthesia    slow to awaken to awaken and confusion after fem /pop bypass 02-05-2016  . Concussion    age 71  . Depression    history of years ago about 20 years ago  . Diabetes mellitus without complication ([place])    type 2  . GERD (gastroesophageal reflux disease)   . Hypertension   . Neuromuscular disorder (HCC)    neuropathy  . PAD (peripheral artery disease) ([place])   . PTSD (post-traumatic stress disorder)   . PTSD (post-traumatic stress disorder)    Past Surgical History:  Procedure Laterality Date  . ANGIOPLASTY ILLIAC ARTERY  02/05/2016   Procedure: Right Balloon ANGIOPLASTY External  ILIAC ARTERY;  Surgeon: Rosetta Posner, MD;  Location: [place];  Service: Vascular;;  . APPENDECTOMY  yrs ago   ruptured  . ENDARTERECTOMY FEMORAL Right 02/05/2016   Procedure: Bilateral ENDARTERECTOMY FEMORAL;  Surgeon: Rosetta Posner, MD;  Location: [place];  Service: Vascular;  Laterality: Right;  . EYE SURGERY Bilateral    cataracts removed  . FEMORAL-FEMORAL BYPASS GRAFT Bilateral 02/05/2016   Procedure: BYPASS GRAFT RIGHT FEMORAL-LEFT FEMORAL ARTERY;  Surgeon: Rosetta Posner, MD;  Location: [place];  Service: Vascular;  Laterality: Bilateral;  . INTRAOPERATIVE  ARTERIOGRAM  02/05/2016   Procedure: INTRA OPERATIVE ARTERIOGRAM;  Surgeon: Rosetta Posner, MD;  Location: [place];  Service: Vascular;;  . IR RADIOLOGIST EVAL & MGMT  12/20/2016  . PERIPHERAL VASCULAR CATHETERIZATION N/A 11/16/2015   Procedure: Abdominal Aortogram w/Lower Extremity;  Surgeon: Angelia Mould, MD;  Location: [place] [facility];  Service: Cardiovascular;  Laterality: N/A;  . REPLACEMENT TOTAL KNEE Left 2001     Allergies: Patient has no known allergies.  Medications: Prior to Admission medications   Medication Sig Start Date End Date Taking? Authorizing Provider  albuterol-ipratropium (COMBIVENT) 18-103 MCG/ACT inhaler Inhale 2 puffs into the lungs 2 (two) times daily.   Yes [provider]  Ascorbic Acid (VITAMIN C) 1000 MG tablet Take 1,000 mg by mouth daily.   Yes [provider]  Aspirin-Acetaminophen-Caffeine (EXCEDRIN PO) Take 1 tablet by mouth as needed (PAIN).   Yes [provider]  Chlorpheniramine-APAP (CORICIDIN) 2-325 MG TABS Take 2 tablets by mouth every 4 (four) hours as needed (for cold symptoms.).   Yes [provider]  gabapentin (NEURONTIN) 300 MG capsule Take 300-600 mg by mouth 4 (four) times daily. Take 1 capsule (300 mg) in the morning with waking, Take 2 capsules (600 mg) by mouth three times daily   Yes [provider]  insulin aspart protamine- aspart (NOVOLOG MIX 70/30) (70-30) 100 UNIT/ML injection Inject 50 Units into the skin 2 (two) times daily.   Yes [provider]  lisinopril-hydrochlorothiazide (PRINZIDE,ZESTORETIC) 20-25 MG tablet Take 1 tablet by mouth daily.  Yes [provider]  metFORMIN (GLUCOPHAGE) 1000 MG tablet Take 1,000 mg by mouth 2 (two) times daily with a meal.   Yes [provider]  Multiple Vitamin (MULTIVITAMIN WITH MINERALS) TABS tablet Take 1 tablet by mouth daily.   Yes [provider]  omeprazole (PRILOSEC) 20 MG capsule Take 20 mg by mouth  daily.   Yes [provider]  polyvinyl alcohol (LIQUIFILM TEARS) 1.4 % ophthalmic solution Place 1 drop into both eyes as needed for dry eyes.    Yes [provider]  sildenafil (VIAGRA) 100 MG tablet Take 100 mg by mouth daily as needed for erectile dysfunction.   Yes [provider]  atorvastatin (LIPITOR) 80 MG tablet Take 80 mg by mouth at bedtime.    [provider]     Vital Signs: Vitals:   03/01/17 1035  BP: (!) 125/55  Pulse: 66  Resp: 18  Temp: 98.4 F (36.9 C)  SpO2: 100%      Physical Exam awake, alert.  Chest clear to auscultation bilaterally.  Heart with normal rate, occasional ectopy noted.  Abdomen soft, positive bowel sounds, nontender.  No lower extremity edema.  Imaging: No results found.  Labs:  CBC: Recent Labs    02/24/17 1041  WBC 6.9  HGB 7.7*  HCT 26.6*  PLT 385    COAGS: Recent Labs    02/24/17 1041  INR 1.14    BMP: Recent Labs    02/24/17 1041  NA 132*  K 4.9  CL 97*  CO2 26  GLUCOSE 239*  BUN 24*  CALCIUM 9.0  CREATININE 1.04  GFRNONAA >60  GFRAA >60    LIVER FUNCTION TESTS: No results for input(s): BILITOT, AST, ALT, ALKPHOS, PROT, ALBUMIN in the last 8760 hours.  Assessment and Plan: Patient with history of 2.3 cm right renal mass suspicious for renal cell carcinoma; seen in consultation by IR on 12/20/16 and deemed an appropriate candidate for CT-guided biopsy and cryoablation of the mass.  He presents today for the procedure.  Details/risks of procedure, including but not limited to, internal bleeding, infection, injury to kidney/adjacent structures, anesthesia related complications discussed with patient and sister with their understanding and consent.  Postprocedure he will be admitted to the hospital for overnight observation. Labs pend. This procedure involves the use of X-rays and because of the nature of the planned procedure, it is possible that we will have prolonged use of  CT.  Potential radiation risks to you include (but are not limited to) the following: - A slightly elevated risk for cancer  several years later in life. This risk is typically less than 0.5% percent. This risk is low in comparison to the normal incidence of human cancer, which is 33% for women and 50% for men according to the [employer]. - Radiation induced injury can include skin redness, resembling a rash, tissue breakdown / ulcers and hair loss (which can be temporary or permanent).   The likelihood of either of these occurring depends on the difficulty of the procedure and whether you are sensitive to radiation due to previous procedures, disease, or genetic conditions.   IF your procedure requires a prolonged use of radiation, you will be notified and given written instructions for further action.  It is your responsibility to monitor the irradiated area for the 2 weeks following the procedure and to notify your physician if you are concerned that you have suffered a radiation induced injury.     Electronically  Signed: D. Rowe Robert, PA-C 03/01/2017, 10:33 AM   I spent a total of 30 minutes  at the the patient's bedside AND on the patient's hospital floor or unit, greater than 50% of which was counseling/coordinating care for CT-guided cryoablation/possible biopsy of right renal mass

## 2017-03-01 NOTE — Progress Notes (Signed)
Referring-Brian Moser MD Reason for referral-Atrial flutter.   HPI: 71 yo male for evaluation of atrial flutter at request of Oleta Mouse MD. Pt has h/o PVD with prior angioplasty of right external iliac artery, bilateral femoral artery endarterectomy, and right to left fem-fem bypass graft on 02-05-16. Presented for CT guided cryoablation and bx of 2.3 cm right renal mass yesterday. ECG from 02/05/16 noted to have mobitz 1; ECG yesterday showed typical atrial flutter. Procedure canceled and cardiology asked to evaluate preoperatively. Patient does have some dyspnea on exertion but no orthopnea, PND, pedal edema, palpitations, syncope or chest pain. He was seen at [facility] in the recent past for a transient episode of dizziness for 1 minute. No syncope.  Current Outpatient Medications  Medication Sig Dispense Refill  . albuterol-ipratropium (COMBIVENT) 18-103 MCG/ACT inhaler Inhale 2 puffs into the lungs 2 (two) times daily.    . Ascorbic Acid (VITAMIN C) 1000 MG tablet Take 1,000 mg by mouth daily.    . Aspirin-Acetaminophen-Caffeine (EXCEDRIN PO) Take 1 tablet by mouth as needed (PAIN).    Marland Kitchen atorvastatin (LIPITOR) 80 MG tablet Take 80 mg by mouth at bedtime.    . Chlorpheniramine-APAP (CORICIDIN) 2-325 MG TABS Take 2 tablets by mouth every 4 (four) hours as needed (for cold symptoms.).    Marland Kitchen gabapentin (NEURONTIN) 300 MG capsule Take 300-600 mg by mouth 4 (four) times daily. Take 1 capsule (300 mg) in the morning with waking, Take 2 capsules (600 mg) by mouth three times daily    . insulin aspart protamine- aspart (NOVOLOG MIX 70/30) (70-30) 100 UNIT/ML injection Inject 50 Units into the skin 2 (two) times daily.     Marland Kitchen lisinopril-hydrochlorothiazide (PRINZIDE,ZESTORETIC) 20-25 MG tablet Take 1 tablet by mouth daily.    . metFORMIN (GLUCOPHAGE) 1000 MG tablet Take 1,000 mg by mouth 2 (two) times daily with a meal.    . Multiple Vitamin (MULTIVITAMIN WITH MINERALS) TABS tablet  Take 1 tablet by mouth daily.    Marland Kitchen omeprazole (PRILOSEC) 20 MG capsule Take 20 mg by mouth daily.    . polyvinyl alcohol (LIQUIFILM TEARS) 1.4 % ophthalmic solution Place 1 drop into both eyes as needed for dry eyes.     . sildenafil (VIAGRA) 100 MG tablet Take 100 mg by mouth daily as needed for erectile dysfunction.     No current facility-administered medications for this visit.     No Known Allergies   Past Medical History:  Diagnosis Date  . Arthritis   . Atrial flutter ([place])   . Complication of anesthesia    slow to awaken to awaken and confusion after fem /pop bypass 02-05-2016  . Concussion    age 72  . Depression    history of years ago about 20 years ago  . Diabetes mellitus without complication ([place])    type 2  . GERD (gastroesophageal reflux disease)   . Hyperlipidemia   . Hypertension   . Neuromuscular disorder (HCC)    neuropathy  . PAD (peripheral artery disease) ([place]) 02/2016   R->L fem-fem bpg  . PTSD (post-traumatic stress disorder)     Past Surgical History:  Procedure Laterality Date  . ANGIOPLASTY ILLIAC ARTERY  02/05/2016   Procedure: Right Balloon ANGIOPLASTY External  ILIAC ARTERY;  Surgeon: Rosetta Posner, MD;  Location: [place];  Service: Vascular;;  . APPENDECTOMY  yrs ago   ruptured  . ENDARTERECTOMY FEMORAL Right 02/05/2016   Procedure: Bilateral ENDARTERECTOMY FEMORAL;  Surgeon: Rosetta Posner,  MD;  Location: [place];  Service: Vascular;  Laterality: Right;  . EYE SURGERY Bilateral    cataracts removed  . FEMORAL-FEMORAL BYPASS GRAFT Bilateral 02/05/2016   Procedure: BYPASS GRAFT RIGHT FEMORAL-LEFT FEMORAL ARTERY;  Surgeon: Rosetta Posner, MD;  Location: [place];  Service: Vascular;  Laterality: Bilateral;  . INTRAOPERATIVE ARTERIOGRAM  02/05/2016   Procedure: INTRA OPERATIVE ARTERIOGRAM;  Surgeon: Rosetta Posner, MD;  Location: [place];  Service: Vascular;;  . IR RADIOLOGIST EVAL & MGMT  12/20/2016  . PERIPHERAL VASCULAR CATHETERIZATION N/A 11/16/2015    Procedure: Abdominal Aortogram w/Lower Extremity;  Surgeon: Angelia Mould, MD;  Location: [place] [facility];  Service: Cardiovascular;  Laterality: N/A;  . REPLACEMENT TOTAL KNEE Left 2001    Social History   Socioeconomic History  . Marital status: Single    Spouse name: Not on file  . Number of children: 3  . Years of education: Not on file  . Highest education level: Not on file  Social Needs  . Financial resource strain: Not on file  . Food insecurity - worry: Not on file  . Food insecurity - inability: Not on file  . Transportation needs - medical: Not on file  . Transportation needs - non-medical: Not on file  Occupational History    Comment: Retired  Tobacco Use  . Smoking status: Current Every Day Smoker    Packs/day: 1.00    Years: 40.00    Pack years: 40.00    Types: Cigarettes  . Smokeless tobacco: Never Used  . Tobacco comment: 2 pks per day  Substance and Sexual Activity  . Alcohol use: No  . Drug use: No  . Sexual activity: No  Other Topics Concern  . Not on file  Social History Narrative  . Not on file    Family History  Problem Relation Age of Onset  . Diabetes Mother   . Heart failure Mother   . Stroke Father     ROS: back and leg pain but no fevers or chills, productive cough, hemoptysis, dysphasia, odynophagia, melena, hematochezia, dysuria, hematuria, rash, seizure activity, orthopnea, PND, pedal edema. Remaining systems are negative.  Physical Exam:   Blood pressure (!) 122/58, pulse 69, height 6' (1.829 m), weight 181 lb (82.1 kg).  General:  Well developed/well nourished in NAD Skin warm/dry Patient not depressed No peripheral clubbing Back-normal HEENT-normal/normal eyelids Neck supple/normal carotid upstroke bilaterally; left carotid bruit; no JVD; no thyromegaly chest - CTA/ normal expansion CV - RRR/normal S1 and S2; no rubs or gallops;  PMI nondisplaced, 2/6 systolic murmur Abdomen -NT/ND, no HSM, no mass, + bowel  sounds, no bruit 2+ femoral pulses, no bruits Ext-no edema, chords, diminished distal pulses Neuro-grossly nonfocal  ECG - typical atrial flutter, PVC. personally reviewed  A/P  1 newly diagnosed atrial flutter-plan check TSH and echocardiogram. Embolic risk factors include age greater than 65, hypertension and diabetes mellitus. CHADSvasc 3. Add apixaban 5 mg twice daily.Note patient has difficulty with balance because of prior knee surgery and peripheral neuropathy. I would like to avoid long-term anticoagulation because of frequent falls. I will ask electrophysiology to see him for consideration of ablation. Hopefully ablation can be accomplished relatively quickly because he will need to be off of anticoagulation prior to ablation of renal mass. Note heart rate is controlled on no medications.  2 preoperative evaluation prior to ablation of renal mass-patient has multiple risk factors including long history of diabetes mellitus and tobacco abuse. Also hypertension and hyperlipidemia.  He has documented vascular disease. I will arrange a [place] nuclear study for risk stratification.  3 history of Mobitz 1 on prior electrocardiogram--we will need to watch closely for bradycardia following ablation.  4 hypertension-blood pressure is controlled. Continue present medications.  5 hyperlipidemia-continue statin.  6 vascular disease-continue statin. No aspirin given need for anticoagulation.  7 kidney mass-he will ultimately require ablation. If nuclear study negative and atrial flutter ablation cannot be accomplished relatively soon, would likely allow patient to proceed with ablation of kidney mass first and atrial flutter ablation later.  8 tobacco abuse-patient counseled on discontinuing.  9 carotid bruit-schedule carotid Dopplers.  Kirk Ruths, MD

## 2017-03-01 NOTE — Progress Notes (Signed)
Anesthesiology Note:  Patient arrived today for IR ablation of renal cell Ca. On review of chart an EKG from 02/2016 showed type 2 AV block (Mobitz 1). The patient denies ever seeing a Cardiologist in his lifetime. The patient was placed on the monitor in preop and appeared to be in aflutter which was confirmed with EKG. Patient's rate was controlled and VSS. He reports passing out once in September 2018 without cardiac workup as well. Decision to cancel the case today pending cardiac evaluation and management of aflutter. [facility] group was consulted and arranged outpatient evaluation on 03/01/17 at 0800 per the request of Dr Percival Spanish. The patient and his family were explained this plan and expressed understanding and were in agreement. Directions and appointment information were explained to the patient by the preop RN.  Oleta Mouse, MD 03/01/2017 3:38 PM

## 2017-03-01 NOTE — Progress Notes (Signed)
[facility] returned call.  Will see pt. As outpatient in office tomorrow am at 8:00am.  Pt. Dressing for discharge IV D/C'ed.

## 2017-03-02 ENCOUNTER — Encounter: Payer: Self-pay | Admitting: Cardiology

## 2017-03-02 ENCOUNTER — Ambulatory Visit (INDEPENDENT_AMBULATORY_CARE_PROVIDER_SITE_OTHER): Payer: Medicare Other | Admitting: Cardiology

## 2017-03-02 VITALS — BP 122/58 | HR 69 | Ht 72.0 in | Wt 181.0 lb

## 2017-03-02 DIAGNOSIS — Z01818 Encounter for other preprocedural examination: Secondary | ICD-10-CM | POA: Diagnosis not present

## 2017-03-02 DIAGNOSIS — I779 Disorder of arteries and arterioles, unspecified: Secondary | ICD-10-CM | POA: Diagnosis not present

## 2017-03-02 DIAGNOSIS — R0989 Other specified symptoms and signs involving the circulatory and respiratory systems: Secondary | ICD-10-CM

## 2017-03-02 DIAGNOSIS — R0609 Other forms of dyspnea: Secondary | ICD-10-CM | POA: Diagnosis not present

## 2017-03-02 DIAGNOSIS — I483 Typical atrial flutter: Secondary | ICD-10-CM

## 2017-03-02 LAB — TSH: TSH: 2.07 u[IU]/mL (ref 0.450–4.500)

## 2017-03-02 MED ORDER — APIXABAN 5 MG PO TABS: 5.0000 mg | ORAL_TABLET | Freq: Two times a day (BID) | ORAL | 6 refills | Status: DC

## 2017-03-02 NOTE — Patient Instructions (Signed)
Medication Instructions:   START ELIQUIS 5 MG ONE TABLET TWICE DAILY  Labwork:  Your physician recommends that you HAVE LAB WORK TODAY  Testing/Procedures:  Your physician has requested that you have an echocardiogram. Echocardiography is a painless test that uses sound waves to create images of your heart. It provides your doctor with information about the size and shape of your heart and how well your heart's chambers and valves are working. This procedure takes approximately one hour. There are no restrictions for this procedure.DONE AT [place]  Your physician has requested that you have a lexiscan myoview. For further information please visit [email or web address]. Please follow instruction sheet, as given.   Your physician has requested that you have a carotid duplex. This test is an ultrasound of the carotid arteries in your neck. It looks at blood flow through these arteries that supply the brain with blood. Allow one hour for this exam. There are no restrictions or special instructions.  Follow-Up:  Your physician recommends that you schedule a follow-up appointment in: [place] ABLATION= IN ONE WEEK

## 2017-03-14 ENCOUNTER — Telehealth (HOSPITAL_COMMUNITY): Payer: Self-pay | Admitting: *Deleted

## 2017-03-14 NOTE — Telephone Encounter (Signed)
Close encounter 

## 2017-03-15 ENCOUNTER — Ambulatory Visit (HOSPITAL_COMMUNITY): Payer: Medicare Other | Attending: Cardiovascular Disease

## 2017-03-15 ENCOUNTER — Other Ambulatory Visit: Payer: Self-pay

## 2017-03-15 DIAGNOSIS — I483 Typical atrial flutter: Secondary | ICD-10-CM | POA: Insufficient documentation

## 2017-03-15 DIAGNOSIS — R0609 Other forms of dyspnea: Secondary | ICD-10-CM | POA: Diagnosis not present

## 2017-03-15 DIAGNOSIS — I119 Hypertensive heart disease without heart failure: Secondary | ICD-10-CM | POA: Insufficient documentation

## 2017-03-15 DIAGNOSIS — E119 Type 2 diabetes mellitus without complications: Secondary | ICD-10-CM | POA: Insufficient documentation

## 2017-03-15 DIAGNOSIS — E785 Hyperlipidemia, unspecified: Secondary | ICD-10-CM | POA: Diagnosis not present

## 2017-03-16 ENCOUNTER — Ambulatory Visit (HOSPITAL_COMMUNITY)
Admission: RE | Admit: 2017-03-16 | Payer: Medicare Other | Source: Ambulatory Visit | Attending: Cardiology | Admitting: Cardiology

## 2017-03-16 ENCOUNTER — Encounter: Payer: Self-pay | Admitting: Cardiology

## 2017-03-23 ENCOUNTER — Telehealth (HOSPITAL_COMMUNITY): Payer: Self-pay

## 2017-03-23 NOTE — Telephone Encounter (Signed)
Encounter complete. 

## 2017-03-24 ENCOUNTER — Telehealth (HOSPITAL_COMMUNITY): Payer: Self-pay

## 2017-03-24 ENCOUNTER — Telehealth: Payer: Self-pay | Admitting: Cardiology

## 2017-03-24 NOTE — Telephone Encounter (Signed)
New message   Patient states that he had some blood work done and his hemoglobin is 7.8 Pt wants to stop his blood thinners please call

## 2017-03-24 NOTE — Telephone Encounter (Signed)
Encounter complete. 

## 2017-03-24 NOTE — Telephone Encounter (Signed)
Returned the call to the patient. He stated that he recently has a visit at his PCP where labs were drawn. His hemoglobin was 7.8. He is asymptomatic. Per the patient, it was recommended by his PCP to hold his Eliquis until labs can be redrawn next week. According to previous labs, his hemoglobin was 7.7 (4 wks ago) and 8.6 (one year ago).  Per Dr. Stanford Breed and pharmd he has been have advised to hold his Eliquis for now. He has been instructed to keep his appointment on 1/22 for his Lexiscan. He verbalized his understanding.  He has been advised to call the on call provider or go to the ED if he becomes light headed, faint or has active bleeding.

## 2017-03-28 ENCOUNTER — Ambulatory Visit (HOSPITAL_COMMUNITY): Payer: Medicare Other

## 2017-03-28 ENCOUNTER — Ambulatory Visit (HOSPITAL_COMMUNITY): Admission: RE | Admit: 2017-03-28 | Payer: Medicare Other | Source: Ambulatory Visit

## 2017-04-13 NOTE — Progress Notes (Deleted)
HPI:71 yo male for evaluation of atrial flutter at request of Oleta Mouse MD. Pt has h/o PVD with prior angioplasty of right external iliac artery, bilateral femoral artery endarterectomy, and right to left fem-fem bypass graft on 02-05-16. Presented for CT guided cryoablation and bx of 2.3 cm right renal mass yesterday. ECG from 02/05/16 noted to have mobitz 1; preop ECG 03/01/17 showed typical atrial flutter. Procedure canceled and cardiology asked to evaluate preoperatively.   Echocardiogram January 2019 showed normal LV systolic function, mildly dilated aortic root, moderate left atrial enlargement.  Nuclear study 2/19 showed ; carotid Dopplers dopplers 2/19 showed .  Since last seen,   Current Outpatient Medications  Medication Sig Dispense Refill  . albuterol-ipratropium (COMBIVENT) 18-103 MCG/ACT inhaler Inhale 2 puffs into the lungs 2 (two) times daily.    Marland Kitchen apixaban (ELIQUIS) 5 MG TABS tablet Take 1 tablet (5 mg total) by mouth 2 (two) times daily. 60 tablet 6  . Ascorbic Acid (VITAMIN C) 1000 MG tablet Take 1,000 mg by mouth daily.    . Aspirin-Acetaminophen-Caffeine (EXCEDRIN PO) Take 1 tablet by mouth as needed (PAIN).    Marland Kitchen atorvastatin (LIPITOR) 80 MG tablet Take 80 mg by mouth at bedtime.    . Chlorpheniramine-APAP (CORICIDIN) 2-325 MG TABS Take 2 tablets by mouth every 4 (four) hours as needed (for cold symptoms.).    Marland Kitchen gabapentin (NEURONTIN) 300 MG capsule Take 300-600 mg by mouth 4 (four) times daily. Take 1 capsule (300 mg) in the morning with waking, Take 2 capsules (600 mg) by mouth three times daily    . insulin aspart protamine- aspart (NOVOLOG MIX 70/30) (70-30) 100 UNIT/ML injection Inject 50 Units into the skin 2 (two) times daily.     Marland Kitchen lisinopril-hydrochlorothiazide (PRINZIDE,ZESTORETIC) 20-25 MG tablet Take 1 tablet by mouth daily.    . metFORMIN (GLUCOPHAGE) 1000 MG tablet Take 1,000 mg by mouth 2 (two) times daily with a meal.    . Multiple Vitamin  (MULTIVITAMIN WITH MINERALS) TABS tablet Take 1 tablet by mouth daily.    Marland Kitchen omeprazole (PRILOSEC) 20 MG capsule Take 20 mg by mouth daily.    . polyvinyl alcohol (LIQUIFILM TEARS) 1.4 % ophthalmic solution Place 1 drop into both eyes as needed for dry eyes.     . sildenafil (VIAGRA) 100 MG tablet Take 100 mg by mouth daily as needed for erectile dysfunction.     No current facility-administered medications for this visit.      Past Medical History:  Diagnosis Date  . Arthritis   . Atrial flutter ([place])   . Complication of anesthesia    slow to awaken to awaken and confusion after fem /pop bypass 02-05-2016  . Concussion    age 10  . Depression    history of years ago about 20 years ago  . Diabetes mellitus without complication ([place])    type 2  . GERD (gastroesophageal reflux disease)   . Hyperlipidemia   . Hypertension   . Neuromuscular disorder (HCC)    neuropathy  . PAD (peripheral artery disease) ([place]) 02/2016   R->L fem-fem bpg  . PTSD (post-traumatic stress disorder)     Past Surgical History:  Procedure Laterality Date  . ANGIOPLASTY ILLIAC ARTERY  02/05/2016   Procedure: Right Balloon ANGIOPLASTY External  ILIAC ARTERY;  Surgeon: Rosetta Posner, MD;  Location: [place];  Service: Vascular;;  . APPENDECTOMY  yrs ago   ruptured  . ENDARTERECTOMY FEMORAL Right 02/05/2016   Procedure:  Bilateral ENDARTERECTOMY FEMORAL;  Surgeon: Rosetta Posner, MD;  Location: [place];  Service: Vascular;  Laterality: Right;  . EYE SURGERY Bilateral    cataracts removed  . FEMORAL-FEMORAL BYPASS GRAFT Bilateral 02/05/2016   Procedure: BYPASS GRAFT RIGHT FEMORAL-LEFT FEMORAL ARTERY;  Surgeon: Rosetta Posner, MD;  Location: [place];  Service: Vascular;  Laterality: Bilateral;  . INTRAOPERATIVE ARTERIOGRAM  02/05/2016   Procedure: INTRA OPERATIVE ARTERIOGRAM;  Surgeon: Rosetta Posner, MD;  Location: [place];  Service: Vascular;;  . IR RADIOLOGIST EVAL & MGMT  12/20/2016  . PERIPHERAL VASCULAR CATHETERIZATION N/A  11/16/2015   Procedure: Abdominal Aortogram w/Lower Extremity;  Surgeon: Angelia Mould, MD;  Location: [place] [facility];  Service: Cardiovascular;  Laterality: N/A;  . REPLACEMENT TOTAL KNEE Left 2001    Social History   Socioeconomic History  . Marital status: Single    Spouse name: Not on file  . Number of children: 3  . Years of education: Not on file  . Highest education level: Not on file  Social Needs  . Financial resource strain: Not on file  . Food insecurity - worry: Not on file  . Food insecurity - inability: Not on file  . Transportation needs - medical: Not on file  . Transportation needs - non-medical: Not on file  Occupational History    Comment: Retired  Tobacco Use  . Smoking status: Current Every Day Smoker    Packs/day: 1.00    Years: 40.00    Pack years: 40.00    Types: Cigarettes  . Smokeless tobacco: Never Used  . Tobacco comment: 2 pks per day  Substance and Sexual Activity  . Alcohol use: No  . Drug use: No  . Sexual activity: No  Other Topics Concern  . Not on file  Social History Narrative  . Not on file    Family History  Problem Relation Age of Onset  . Diabetes Mother   . Heart failure Mother   . Stroke Father     ROS: no fevers or chills, productive cough, hemoptysis, dysphasia, odynophagia, melena, hematochezia, dysuria, hematuria, rash, seizure activity, orthopnea, PND, pedal edema, claudication. Remaining systems are negative.  Physical Exam: Well-developed well-nourished in no acute distress.  Skin is warm and dry.  HEENT is normal.  Neck is supple.  Chest is clear to auscultation with normal expansion.  Cardiovascular exam is regular rate and rhythm.  Abdominal exam nontender or distended. No masses palpated. Extremities show no edema. neuro grossly intact  ECG- personally reviewed  A/P  1  Kirk Ruths, MD

## 2017-04-14 ENCOUNTER — Telehealth (HOSPITAL_COMMUNITY): Payer: Self-pay

## 2017-04-14 NOTE — Telephone Encounter (Signed)
Encounter complete. 

## 2017-04-18 ENCOUNTER — Ambulatory Visit (HOSPITAL_COMMUNITY): Payer: Medicare Other

## 2017-04-18 ENCOUNTER — Ambulatory Visit (HOSPITAL_COMMUNITY)
Admission: RE | Admit: 2017-04-18 | Payer: Medicare Other | Source: Ambulatory Visit | Attending: Cardiology | Admitting: Cardiology

## 2017-04-25 ENCOUNTER — Ambulatory Visit: Payer: Medicare Other | Admitting: Cardiology

## 2017-05-30 ENCOUNTER — Other Ambulatory Visit: Payer: Self-pay

## 2017-05-30 ENCOUNTER — Ambulatory Visit (INDEPENDENT_AMBULATORY_CARE_PROVIDER_SITE_OTHER): Payer: Medicare Other | Admitting: Family

## 2017-05-30 ENCOUNTER — Ambulatory Visit (HOSPITAL_COMMUNITY)
Admission: RE | Admit: 2017-05-30 | Discharge: 2017-05-30 | Disposition: A | Discharge status: Home / Self Care | Payer: Non-veteran care | Source: Ambulatory Visit | Attending: Family | Admitting: Family

## 2017-05-30 ENCOUNTER — Encounter: Payer: Self-pay | Admitting: Family

## 2017-05-30 ENCOUNTER — Telehealth: Payer: Self-pay | Admitting: *Deleted

## 2017-05-30 VITALS — BP 147/58 | HR 58 | Temp 97.1°F | Resp 18 | Ht 72.0 in | Wt 197.0 lb

## 2017-05-30 DIAGNOSIS — F17200 Nicotine dependence, unspecified, uncomplicated: Secondary | ICD-10-CM | POA: Diagnosis not present

## 2017-05-30 DIAGNOSIS — I739 Peripheral vascular disease, unspecified: Secondary | ICD-10-CM | POA: Diagnosis not present

## 2017-05-30 DIAGNOSIS — I779 Disorder of arteries and arterioles, unspecified: Secondary | ICD-10-CM | POA: Insufficient documentation

## 2017-05-30 NOTE — Telephone Encounter (Signed)
Spoke with henri at dr shick's office. They are asking about clearance for the patient regarding a right renal mass, explained he had a low hemoglobin and his blood thinner were stopped and we have not heard back from him. He has canceled all his follow up appointments. Left message for pt to call to see how he is doing and his current statis. He will need a follow up appointment if clearance is still needed.

## 2017-05-30 NOTE — Patient Instructions (Signed)
Steps to Quit Smoking Smoking tobacco can be bad for your health. It can also affect almost every organ in your body. Smoking puts you and people around you at risk for many serious long-lasting (chronic) diseases. Quitting smoking is hard, but it is one of the best things that you can do for your health. It is never too late to quit. What are the benefits of quitting smoking? When you quit smoking, you lower your risk for getting serious diseases and conditions. They can include:  Lung cancer or lung disease.  Heart disease.  Stroke.  Heart attack.  Not being able to have children (infertility).  Weak bones (osteoporosis) and broken bones (fractures).  If you have coughing, wheezing, and shortness of breath, those symptoms may get better when you quit. You may also get sick less often. If you are pregnant, quitting smoking can help to lower your chances of having a baby of low birth weight. What can I do to help me quit smoking? Talk with your doctor about what can help you quit smoking. Some things you can do (strategies) include:  Quitting smoking totally, instead of slowly cutting back how much you smoke over a period of time.  Going to in-person counseling. You are more likely to quit if you go to many counseling sessions.  Using resources and support systems, such as: ? Online chats with a counselor. ? Phone quitlines. ? Printed self-help materials. ? Support groups or group counseling. ? Text messaging programs. ? Mobile phone apps or applications.  Taking medicines. Some of these medicines may have nicotine in them. If you are pregnant or breastfeeding, do not take any medicines to quit smoking unless your doctor says it is okay. Talk with your doctor about counseling or other things that can help you.  Talk with your doctor about using more than one strategy at the same time, such as taking medicines while you are also going to in-person counseling. This can help make  quitting easier. What things can I do to make it easier to quit? Quitting smoking might feel very hard at first, but there is a lot that you can do to make it easier. Take these steps:  Talk to your family and friends. Ask them to support and encourage you.  Call phone quitlines, reach out to support groups, or work with a counselor.  Ask people who smoke to not smoke around you.  Avoid places that make you want (trigger) to smoke, such as: ? Bars. ? Parties. ? Smoke-break areas at work.  Spend time with people who do not smoke.  Lower the stress in your life. Stress can make you want to smoke. Try these things to help your stress: ? Getting regular exercise. ? Deep-breathing exercises. ? Yoga. ? Meditating. ? Doing a body scan. To do this, close your eyes, focus on one area of your body at a time from head to toe, and notice which parts of your body are tense. Try to relax the muscles in those areas.  Download or buy apps on your mobile phone or tablet that can help you stick to your quit plan. There are many free apps, such as QuitGuide from the CDC (Centers for Disease Control and Prevention). You can find more support from smokefree.gov and other websites.  This information is not intended to replace advice given to you by your health care provider. Make sure you discuss any questions you have with your health care provider. Document Released: 12/18/2008 Document   Revised: 10/20/2015 Document Reviewed: 07/08/2014 Elsevier Interactive Patient Education  2018 Elsevier Inc.     Peripheral Vascular Disease Peripheral vascular disease (PVD) is a disease of the blood vessels that are not part of your heart and brain. A simple term for PVD is poor circulation. In most cases, PVD narrows the blood vessels that carry blood from your heart to the rest of your body. This can result in a decreased supply of blood to your arms, legs, and internal organs, like your stomach or kidneys.  However, it most often affects a person's lower legs and feet. There are two types of PVD.  Organic PVD. This is the more common type. It is caused by damage to the structure of blood vessels.  Functional PVD. This is caused by conditions that make blood vessels contract and tighten (spasm).  Without treatment, PVD tends to get worse over time. PVD can also lead to acute ischemic limb. This is when an arm or limb suddenly has trouble getting enough blood. This is a medical emergency. Follow these instructions at home:  Take medicines only as told by your doctor.  Do not use any tobacco products, including cigarettes, chewing tobacco, or electronic cigarettes. If you need help quitting, ask your doctor.  Lose weight if you are overweight, and maintain a healthy weight as told by your doctor.  Eat a diet that is low in fat and cholesterol. If you need help, ask your doctor.  Exercise regularly. Ask your doctor for some good activities for you.  Take good care of your feet. ? Wear comfortable shoes that fit well. ? Check your feet often for any cuts or sores. Contact a doctor if:  You have cramps in your legs while walking.  You have leg pain when you are at rest.  You have coldness in a leg or foot.  Your skin changes.  You are unable to get or have an erection (erectile dysfunction).  You have cuts or sores on your feet that are not healing. Get help right away if:  Your arm or leg turns cold and blue.  Your arms or legs become red, warm, swollen, painful, or numb.  You have chest pain or trouble breathing.  You suddenly have weakness in your face, arm, or leg.  You become very confused or you cannot speak.  You suddenly have a very bad headache.  You suddenly cannot see. This information is not intended to replace advice given to you by your health care provider. Make sure you discuss any questions you have with your health care provider. Document Released:  05/18/2009 Document Revised: 07/30/2015 Document Reviewed: 08/01/2013 Elsevier Interactive Patient Education  2017 Elsevier Inc.  

## 2017-05-30 NOTE — Progress Notes (Signed)
Vitals:   05/30/17 1520  BP: (!) 151/61  Pulse: 60  Resp: 18  Temp: (!) 97.1 F (36.2 C)  TempSrc: Oral  SpO2: 100%  Weight: 197 lb (89.4 kg)  Height: 6' (1.829 m)

## 2017-05-30 NOTE — Progress Notes (Signed)
[facility] [place]   CC: Follow up peripheral artery occlusive disease  History of Present Illness Brian Pratt is a 72 y.o. male s/p angioplasty of right external iliac artery, bilateral femoral artery endarterectomy, and right to left fem-fem bypass graft on 02-05-16 by Dr. Donnetta Hutching.  He has been able to walk better. He does have multiple ongoing complaints. 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 Also has severe degenerative disc disease. 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 He does have claudication mostly in his calves. This does make it difficult for him to do routine activities that time but had no tissue loss. His DM neuropathy is very bothersome to him.   He has fallen 17 times since March 07, 2017.   Dr. Donnetta Hutching last evaluated pt on 05-24-16. At that time right ankle arm index of 0.68 and 0.53 on the left with monophasic waveforms Stable overall. Did undergo extremely extensive iliac and femoral endarterectomies bilaterally with a right to left femorofemoral bypass and also stenting of his inflow external iliac artery. Fortunately he has maintained patency of this and has tolerable claudication. He was to continue his walking program and will see [place] again in 6 months. He was to notify us should he develop any worsening symptoms.  He states he has no known heart problems.  He denies any known hx of stroke, but might have had a TIA May 2018 as manifested by transient confusion. He states ultrasound of his neck arteries was done at [facility] at that time; no carotid duplex results on file here.   He states his PTSD has returned and he is under tx for this.   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 Nila Nephew is his urologist in [place].   He states he walks 1/2 mile in his daily routine. He will be getting a power  w/c.  He volunteers with [employer].   Pt Diabetic: Yes, last A1C result on file was 9.4 on 02-24-17, he states his endocrinologist is Dr. Chana Bode in the [facility] Pt smoker: smoker  (1 1/3 ppd, started at age 31 yrs)  Pt meds include: Statin :Yes Betablocker: No ASA: No, pt states years ago he had anemia and blood was found in his stool, states ASA was stopped at that time Other anticoagulants/antiplatelets: Eliquis was started when he was diagnosed with atrial flutter in January 2019, but found that he became more anemic, and this was stopped, no GI source of bleed found     Past Medical History:  Diagnosis Date  . Arthritis   . Atrial flutter ([place])   . Complication of anesthesia    slow to awaken to awaken and confusion after fem /pop bypass 02-05-2016  . Concussion    age 89  . Depression    history of years ago about 20 years ago  . Diabetes mellitus without complication ([place])    type 2  . GERD (gastroesophageal reflux disease)   . Hyperlipidemia   . Hypertension   . Neuromuscular disorder (HCC)    neuropathy  . PAD (peripheral artery disease) ([place]) 02/2016   R->L fem-fem bpg  . PTSD (post-traumatic stress disorder)     Social History Social History   Tobacco Use  . Smoking status: Current Every Day Smoker    Packs/day: 1.00    Years: 40.00    Pack  years: 40.00    Types: Cigarettes  . Smokeless tobacco: Never Used  . Tobacco comment: 2 pks per day  Substance Use Topics  . Alcohol use: No  . Drug use: No    Family History Family History  Problem Relation Age of Onset  . Diabetes Mother   . Heart failure Mother   . Stroke Father     Past Surgical History:  Procedure Laterality Date  . ANGIOPLASTY ILLIAC ARTERY  02/05/2016   Procedure: Right Balloon ANGIOPLASTY External  ILIAC ARTERY;  Surgeon: Rosetta Posner, MD;  Location: [place];  Service: Vascular;;  . APPENDECTOMY  yrs ago   ruptured  . ENDARTERECTOMY FEMORAL Right 02/05/2016   Procedure: Bilateral  ENDARTERECTOMY FEMORAL;  Surgeon: Rosetta Posner, MD;  Location: [facility];  Service: Vascular;  Laterality: Right;  . EYE SURGERY Bilateral    cataracts removed  . FEMORAL-FEMORAL BYPASS GRAFT Bilateral 02/05/2016   Procedure: BYPASS GRAFT RIGHT FEMORAL-LEFT FEMORAL ARTERY;  Surgeon: Rosetta Posner, MD;  Location: [place];  Service: Vascular;  Laterality: Bilateral;  . INTRAOPERATIVE ARTERIOGRAM  02/05/2016   Procedure: INTRA OPERATIVE ARTERIOGRAM;  Surgeon: Rosetta Posner, MD;  Location: [place];  Service: Vascular;;  . IR RADIOLOGIST EVAL & MGMT  12/20/2016  . PERIPHERAL VASCULAR CATHETERIZATION N/A 11/16/2015   Procedure: Abdominal Aortogram w/Lower Extremity;  Surgeon: Angelia Mould, MD;  Location: [facility];  Service: Cardiovascular;  Laterality: N/A;  . REPLACEMENT TOTAL KNEE Left 2001    No Known Allergies  Current Outpatient Medications  Medication Sig Dispense Refill  . albuterol-ipratropium (COMBIVENT) 18-103 MCG/ACT inhaler Inhale 2 puffs into the lungs 2 (two) times daily.    Marland Kitchen atorvastatin (LIPITOR) 80 MG tablet Take 80 mg by mouth at bedtime.    . COMBIVENT RESPIMAT 20-100 MCG/ACT AERS respimat     . gabapentin (NEURONTIN) 300 MG capsule Take 300-600 mg by mouth 4 (four) times daily. Take 1 capsule (300 mg) in the morning with waking, Take 2 capsules (600 mg) by mouth three times daily    . insulin aspart protamine- aspart (NOVOLOG MIX 70/30) (70-30) 100 UNIT/ML injection Inject 50 Units into the skin 2 (two) times daily.     Marland Kitchen lisinopril-hydrochlorothiazide (PRINZIDE,ZESTORETIC) 20-25 MG tablet Take 1 tablet by mouth daily.    . metFORMIN (GLUCOPHAGE) 1000 MG tablet Take 1,000 mg by mouth 2 (two) times daily with a meal.    . Multiple Vitamin (MULTIVITAMIN WITH MINERALS) TABS tablet Take 1 tablet by mouth daily.    Marland Kitchen omeprazole (PRILOSEC) 20 MG capsule Take 20 mg by mouth daily.    . polyvinyl alcohol (LIQUIFILM TEARS) 1.4 % ophthalmic solution Place 1 drop into both eyes as  needed for dry eyes.     Marland Kitchen rOPINIRole (REQUIP) 0.25 MG tablet     . sildenafil (VIAGRA) 100 MG tablet Take 100 mg by mouth daily as needed for erectile dysfunction.    Marland Kitchen apixaban (ELIQUIS) 5 MG TABS tablet Take 1 tablet (5 mg total) by mouth 2 (two) times daily. (Patient not taking: Reported on 05/30/2017) 60 tablet 6  . Ascorbic Acid (VITAMIN C) 1000 MG tablet Take 1,000 mg by mouth daily.    . Aspirin-Acetaminophen-Caffeine (EXCEDRIN PO) Take 1 tablet by mouth as needed (PAIN).    . Chlorpheniramine-APAP (CORICIDIN) 2-325 MG TABS Take 2 tablets by mouth every 4 (four) hours as needed (for cold symptoms.).    Marland Kitchen LYRICA 150 MG capsule  No current facility-administered medications for this visit.     ROS: See HPI for pertinent positives and negatives.   Physical Examination  Vitals:   05/30/17 1520 05/30/17 1529  BP: (!) 151/61 (!) 147/58  Pulse: 60 (!) 58  Resp: 18   Temp: (!) 97.1 F (36.2 C)   TempSrc: Oral   SpO2: 100%   Weight: 197 lb (89.4 kg)   Height: 6' (1.829 m)    Body mass index is 26.72 kg/m.  General: A&O x 3, WDWN, male. Gait: limp, antalgic, using cane HENT: No gross abnormalities  Eyes: PERRLA. Pulmonary: Respirations are non labored, CTAB, slightly diminished air movement Cardiac: regular Rhythm and rate, + murmur.         Carotid Bruits Right Left   Negative Positive   Radial pulses are 1+ palpable bilaterally   Adominal aortic pulse is not palpable          Femorofemoral graft pulse is palpable                VASCULAR EXAM: Extremities without ischemic changes,  Gangrene. Healed wound on left medial malleolus.                                                                                                                                                        LE Pulses Right Left       FEMORAL  2+ palpable  2+ palpable        POPLITEAL  not palpable   not palpable       POSTERIOR TIBIAL  not palpable   not palpable        DORSALIS  PEDIS      ANTERIOR TIBIAL not palpable  1+ palpable    Abdomen: soft, NT, asymptomatic ventral hernia only present in the process of sitting up or lying down. Musculoskeletal: no muscle wasting or atrophy.      Neurologic: A&O X 3; Appropriate Affect, sensation is severely diminished in his feet; MOTOR FUNCTION:  moving all extremities equally, motor strength 5/5 throughout. Speech is fluent/normal. CN 2-12 intact. Skin: no rashes, no cellulitis, no ulcers noted. Psychiatric: Thought content is normal, mood appropriate for clinical situation.     ASSESSMENT: GARLEN REINIG is a 72 y.o. male who is s/p angioplasty of right external iliac artery, bilateral femoral artery endarterectomy, and right to left fem-fem bypass graft on 02-05-16.  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 Also has severe degenerative disc disease. He does have claudication mostly in his calves. This does make it difficult for him to do routine activities that time but had no tissue loss.  His atherosclerotic risk factors include uncontrolled but improving DM and 52+ years hx of smoking, currently at 1 1/3  ppd., trying to decrease.    DATA  ABI (Date: 05/30/2017):  R:   ABI: 0.63 (was 0.62 on 11-29-16),   PT: mono  DP: mono  TBI:  0.28 (was 0.41)  L:   ABI: 0.51 (was 0.54)),   PT: mono  DP: mono  TBI: 0.22 (was 0.27)  Stable bilateral ABI, moderate disease with monophasic waveforms, mild decline in bilateral TBI.      PLAN:  The patient was counseled re smoking cessation and given several free resources re smoking cessation.   Based on the patient's vascular studies and examination, pt will return to clinic in 1 year with ABI's; I advised him to notify us if he develops concerns re the circulation in his feet or legs.   I discussed in depth with the patient the nature of atherosclerosis, and  emphasized the importance of maximal medical management including strict control of blood pressure, blood glucose, and lipid levels, obtaining regular exercise, and cessation of smoking.  The patient is aware that without maximal medical management the underlying atherosclerotic disease process will progress, limiting the benefit of any interventions.  The patient was given information about PAD including signs, symptoms, treatment, what symptoms should prompt the patient to seek immediate medical care, and risk reduction measures to take.  Clemon Chambers, RN, MSN, FNP-C [facility] Phone: [phone]  Clinic MD: Early  05/30/17 3:32 PM

## 2017-06-05 ENCOUNTER — Telehealth: Payer: Self-pay | Admitting: Radiology

## 2017-06-05 NOTE — Telephone Encounter (Signed)
Cryoablation of Right Renal Mass remains on hold pending cardiac clearance.  Spoke with Neoma Laming at Dr Jacalyn Lefevre office on 05/29/2017.  Patient has canceled NM Myocardial perfusion several times.  Their office left another voice mail on 05/30/2017 requesting patient call to reschedule.     I left a voice mail on patient's phone today at 12:46 pm requesting patient call our office.    Notified Deborah at Dr Ferdie Ping office (referring MD) of the above.  She stated that she will update Dr Nila Nephew and also will attempt to call patient to encourage him to reschedule NM study.  Brolin Dambrosia Riki Rusk, RN 06/05/2017 1:15 PM

## 2017-06-09 ENCOUNTER — Other Ambulatory Visit: Payer: Self-pay

## 2017-06-09 DIAGNOSIS — I739 Peripheral vascular disease, unspecified: Secondary | ICD-10-CM

## 2017-06-09 DIAGNOSIS — I779 Disorder of arteries and arterioles, unspecified: Secondary | ICD-10-CM

## 2017-06-12 DIAGNOSIS — I4892 Unspecified atrial flutter: Secondary | ICD-10-CM | POA: Insufficient documentation

## 2017-06-12 DIAGNOSIS — Z794 Long term (current) use of insulin: Secondary | ICD-10-CM

## 2017-06-12 DIAGNOSIS — E1151 Type 2 diabetes mellitus with diabetic peripheral angiopathy without gangrene: Secondary | ICD-10-CM | POA: Insufficient documentation

## 2017-06-12 DIAGNOSIS — I739 Peripheral vascular disease, unspecified: Secondary | ICD-10-CM | POA: Insufficient documentation

## 2017-06-13 NOTE — Telephone Encounter (Signed)
Spoke with dr shick's office, aware the patient has not returned my calls.

## 2017-07-06 DIAGNOSIS — K219 Gastro-esophageal reflux disease without esophagitis: Secondary | ICD-10-CM | POA: Insufficient documentation

## 2017-07-06 DIAGNOSIS — E785 Hyperlipidemia, unspecified: Secondary | ICD-10-CM | POA: Insufficient documentation

## 2017-09-20 ENCOUNTER — Telehealth: Payer: Self-pay | Admitting: Cardiology

## 2017-09-20 NOTE — Telephone Encounter (Signed)
New Message:       Pt is asking if he can switch from [place] to one of the Doctors in [place] due to his inability to get around that great. Pt also states he has some test that he has pending but would like to do them in [place] if the switch is approved.

## 2017-09-20 NOTE — Telephone Encounter (Signed)
OK I will see him, we are not yet doing Myoview in [place]

## 2017-09-21 ENCOUNTER — Encounter: Payer: Self-pay | Admitting: Cardiology

## 2017-09-21 NOTE — Telephone Encounter (Signed)
Ok with me 

## 2017-09-25 ENCOUNTER — Encounter: Payer: Self-pay | Admitting: Cardiology

## 2017-09-27 ENCOUNTER — Other Ambulatory Visit: Payer: Self-pay | Admitting: *Deleted

## 2017-09-27 DIAGNOSIS — I779 Disorder of arteries and arterioles, unspecified: Secondary | ICD-10-CM

## 2017-09-28 ENCOUNTER — Telehealth (HOSPITAL_COMMUNITY): Payer: Self-pay

## 2017-09-28 NOTE — Telephone Encounter (Signed)
Encounter complete. 

## 2017-10-02 ENCOUNTER — Encounter (HOSPITAL_COMMUNITY): Payer: Self-pay | Admitting: Cardiology

## 2017-10-02 ENCOUNTER — Ambulatory Visit (HOSPITAL_COMMUNITY)
Admission: RE | Admit: 2017-10-02 | Payer: Medicare Other | Source: Ambulatory Visit | Attending: Cardiology | Admitting: Cardiology

## 2017-10-03 ENCOUNTER — Ambulatory Visit (HOSPITAL_COMMUNITY): Admission: RE | Admit: 2017-10-03 | Payer: Medicare Other | Source: Ambulatory Visit

## 2017-10-05 ENCOUNTER — Telehealth (HOSPITAL_COMMUNITY): Payer: Self-pay

## 2017-10-05 NOTE — Telephone Encounter (Signed)
Encounter complete. 

## 2017-10-09 ENCOUNTER — Ambulatory Visit (HOSPITAL_COMMUNITY)
Admission: RE | Admit: 2017-10-09 | Discharge: 2017-10-09 | Disposition: A | Discharge status: Home / Self Care | Payer: Medicare Other | Source: Ambulatory Visit | Attending: Cardiovascular Disease | Admitting: Cardiovascular Disease

## 2017-10-09 DIAGNOSIS — R0989 Other specified symptoms and signs involving the circulatory and respiratory systems: Secondary | ICD-10-CM | POA: Insufficient documentation

## 2017-10-10 ENCOUNTER — Ambulatory Visit (HOSPITAL_COMMUNITY)
Admission: RE | Admit: 2017-10-10 | Discharge: 2017-10-10 | Disposition: A | Discharge status: Home / Self Care | Payer: Medicare Other | Source: Ambulatory Visit | Attending: Cardiology | Admitting: Cardiology

## 2017-10-10 DIAGNOSIS — Z01810 Encounter for preprocedural cardiovascular examination: Secondary | ICD-10-CM | POA: Diagnosis not present

## 2017-10-10 DIAGNOSIS — R9431 Abnormal electrocardiogram [ECG] [EKG]: Secondary | ICD-10-CM

## 2017-10-10 DIAGNOSIS — I779 Disorder of arteries and arterioles, unspecified: Secondary | ICD-10-CM | POA: Diagnosis present

## 2017-10-10 MED ORDER — REGADENOSON 0.4 MG/5ML IV SOLN
0.4000 mg | Freq: Once | INTRAVENOUS | Status: AC
  Administered 2017-10-10: 0.4 mg via INTRAVENOUS

## 2017-10-10 MED ORDER — TECHNETIUM TC 99M TETROFOSMIN IV KIT
10.2000 | PACK | Freq: Once | INTRAVENOUS | Status: AC | PRN
  Administered 2017-10-10: 10.2 via INTRAVENOUS
  Filled 2017-10-10: qty 11

## 2017-10-10 MED ORDER — TECHNETIUM TC 99M TETROFOSMIN IV KIT
29.3000 | PACK | Freq: Once | INTRAVENOUS | Status: AC | PRN
  Administered 2017-10-10: 29.3 via INTRAVENOUS
  Filled 2017-10-10: qty 30

## 2017-10-11 LAB — MYOCARDIAL PERFUSION IMAGING
CHL CUP NUCLEAR SSS: 8
LVDIAVOL: 129 mL (ref 62–150)
LVSYSVOL: 76 mL
NUC STRESS TID: 1.22
Peak HR: 87 {beats}/min
Rest HR: 81 {beats}/min
SDS: 8
SRS: 0

## 2017-10-24 DIAGNOSIS — R9439 Abnormal result of other cardiovascular function study: Secondary | ICD-10-CM | POA: Insufficient documentation

## 2017-10-24 NOTE — Progress Notes (Signed)
Cardiology Office Note:    Date:  10/25/2017   ID:  Brian Pratt, DOB Jan 06, 1946, MRN [id number]  PCP:  Kathreen Devoid, PA-C  Cardiologist:  Shirlee More, MD    Referring MD: Dayton Scrape*    ASSESSMENT:    1. Typical atrial flutter ([place])   2. Hypertensive heart disease, unspecified whether heart failure present   3. Abnormal myocardial perfusion study   4. Hyperlipidemia, unspecified hyperlipidemia type   5. PVD (peripheral vascular disease) ([place])    PLAN:    In order of problems listed above:  1. Improved in sinus rhythm today 2. Stable blood pressure at target continue current treatment 3. In view of his abnormal myocardial perfusion study with ischemic defect reduced EF I asked him to undergo coronary angiography and likely percutaneous intervention.  He has a renal mass and is pending cryoablation.  We discussed timing and I think we should deal with his cardiac issues first as he has not had acute coronary syndrome and he tells me that the mass is stable on serial imaging. 4. Continue his statin 5. Managed by vascular surgery   Next appointment: One month   Medication Adjustments/Labs and Tests Ordered: Current medicines are reviewed at length with the patient today.  Concerns regarding medicines are outlined above.  No orders of the defined types were placed in this encounter.  No orders of the defined types were placed in this encounter.   Chief Complaint  Patient presents with  . New Patient (Initial Visit)    abnormal MPI  . Atrial Flutter  . Hypertension  . Hyperlipidemia  . PAD    History of Present Illness:    Brian Pratt is a 72 y.o. male with a hx of atrial flutter,Mobitz 1 second degree AV block, hypertension, hyperlipidemia renal mass, PVD with prior angioplasty of right external iliac artery, bilateral femoral artery endarterectomy, and right to left fem-fem bypass graft on 02-05-16 last seen 03/02/17. Compliance with diet,  lifestyle and medications: Yes  He tells me that anticoagulant was stopped because of frequent falls with neuropathy and trauma.  He has no cardiovascular complaints of chest pain edema orthopnea palpitations syncope or TIA but he short of breath walking indoors.  He was unaware of the results of his myocardial perfusion study I reviewed the findings suggesting significant CAD high risk markers options benefits and risk informed consent was obtained and scheduled for coronary angiography.  I cannot see the records but he tells me his renal mass is stable and intervention is not urgent.  He has no dye allergy or contraindication to dual antiplatelet therapy Past Medical History:  Diagnosis Date  . Arthritis   . Atrial flutter ([place])   . Complication of anesthesia    slow to awaken to awaken and confusion after fem /pop bypass 02-05-2016  . Concussion    age 64  . Depression    history of years ago about 20 years ago  . Diabetes mellitus without complication ([place])    type 2  . GERD (gastroesophageal reflux disease)   . Hyperlipidemia   . Hypertension   . Neuromuscular disorder (HCC)    neuropathy  . PAD (peripheral artery disease) ([place]) 02/2016   R->L fem-fem bpg  . PTSD (post-traumatic stress disorder)     Past Surgical History:  Procedure Laterality Date  . ANGIOPLASTY ILLIAC ARTERY  02/05/2016   Procedure: Right Balloon ANGIOPLASTY External  ILIAC ARTERY;  Surgeon: Rosetta Posner, MD;  Location:  [facility];  Service: Vascular;;  . APPENDECTOMY  yrs ago   ruptured  . ENDARTERECTOMY FEMORAL Right 02/05/2016   Procedure: Bilateral ENDARTERECTOMY FEMORAL;  Surgeon: Rosetta Posner, MD;  Location: [facility];  Service: Vascular;  Laterality: Right;  . EYE SURGERY Bilateral    cataracts removed  . FEMORAL-FEMORAL BYPASS GRAFT Bilateral 02/05/2016   Procedure: BYPASS GRAFT RIGHT FEMORAL-LEFT FEMORAL ARTERY;  Surgeon: Rosetta Posner, MD;  Location: [facility];  Service: Vascular;  Laterality: Bilateral;  .  INTRAOPERATIVE ARTERIOGRAM  02/05/2016   Procedure: INTRA OPERATIVE ARTERIOGRAM;  Surgeon: Rosetta Posner, MD;  Location: [facility];  Service: Vascular;;  . IR RADIOLOGIST EVAL & MGMT  12/20/2016  . PERIPHERAL VASCULAR CATHETERIZATION N/A 11/16/2015   Procedure: Abdominal Aortogram w/Lower Extremity;  Surgeon: Angelia Mould, MD;  Location: [facility];  Service: Cardiovascular;  Laterality: N/A;  . REPLACEMENT TOTAL KNEE Left 2001    Current Medications: Current Meds  Medication Sig  . albuterol-ipratropium (COMBIVENT) 18-103 MCG/ACT inhaler Inhale 2 puffs into the lungs 2 (two) times daily.  . Ascorbic Acid (VITAMIN C) 1000 MG tablet Take 1,000 mg by mouth daily.  . Aspirin-Acetaminophen-Caffeine (EXCEDRIN PO) Take 1 tablet by mouth as needed (PAIN).  Marland Kitchen atorvastatin (LIPITOR) 80 MG tablet Take 80 mg by mouth at bedtime.  . Continuous Blood Gluc Sensor (FREESTYLE LIBRE 14 DAY SENSOR) MISC Inject into the skin.  . ferrous sulfate 325 (65 FE) MG tablet Take 325 mg by mouth daily.   Marland Kitchen gabapentin (NEURONTIN) 300 MG capsule Take 900 mg by mouth every 4 (four) hours.   Marland Kitchen glimepiride (AMARYL) 4 MG tablet Take 4 mg by mouth daily.   . insulin aspart protamine- aspart (NOVOLOG MIX 70/30) (70-30) 100 UNIT/ML injection Inject 50 Units into the skin 2 (two) times daily.   Marland Kitchen lisinopril-hydrochlorothiazide (PRINZIDE,ZESTORETIC) 20-25 MG tablet Take 1 tablet by mouth daily.  . metFORMIN (GLUCOPHAGE) 1000 MG tablet Take 1,000 mg by mouth 2 (two) times daily with a meal.  . Multiple Vitamin (MULTIVITAMIN WITH MINERALS) TABS tablet Take 1 tablet by mouth daily.  . Omega-3 1000 MG CAPS Take 1,000 capsules by mouth daily.   Marland Kitchen omeprazole (PRILOSEC) 20 MG capsule Take 20 mg by mouth daily.  . polyvinyl alcohol (LIQUIFILM TEARS) 1.4 % ophthalmic solution Place 1 drop into both eyes as needed for dry eyes.   Marland Kitchen rOPINIRole (REQUIP) 0.25 MG tablet Take 0.25 mg by mouth at bedtime.   . sildenafil (VIAGRA) 100  MG tablet Take 100 mg by mouth daily as needed for erectile dysfunction.     Allergies:   Patient has no known allergies.   Social History   Socioeconomic History  . Marital status: Single    Spouse name: Not on file  . Number of children: 3  . Years of education: Not on file  . Highest education level: Not on file  Occupational History    Comment: Retired  Scientific laboratory technician  . Financial resource strain: Not on file  . Food insecurity:    Worry: Not on file    Inability: Not on file  . Transportation needs:    Medical: Not on file    Non-medical: Not on file  Tobacco Use  . Smoking status: Current Every Day Smoker    Packs/day: 1.50    Years: 40.00    Pack years: 60.00    Types: Cigarettes  . Smokeless tobacco: Never Used  Substance and Sexual Activity  . Alcohol use:  No  . Drug use: No  . Sexual activity: Never  Lifestyle  . Physical activity:    Days per week: Not on file    Minutes per session: Not on file  . Stress: Not on file  Relationships  . Social connections:    Talks on phone: Not on file    Gets together: Not on file    Attends religious service: Not on file    Active member of club or organization: Not on file    Attends meetings of clubs or organizations: Not on file    Relationship status: Not on file  Other Topics Concern  . Not on file  Social History Narrative  . Not on file     Family History: The patient's family history includes Diabetes in his mother; Heart failure in his mother; Stroke in his father. ROS:   Please see the history of present illness.    All other systems reviewed and are negative.  EKGs/Labs/Other Studies Reviewed:    The following studies were reviewed today:   EKG:  EKG ordered today.  The ekg ordered today demonstrates sinus rhythm APCs brief run of atrial tachycardia  Myocardial perfusion study 10/10/2017 shows an ejection fraction of 41% with an ischemic defect moderate in the basal anterior mid anterior apical  anterior apical lateral and apex.  He was performed pharmacologically.  Carotid duplex showed 1 to 39% bilateral ICA stenosis  Echocardiogram 03/15/2017 showed mild LVH at that time ejection fraction was normal aortic root was dilated 39 mm moderate left atrial enlargement  Recent Labs: 03/01/2017: BUN 20; Creatinine, Ser 1.14; Hemoglobin 8.5; Platelets 225; Potassium 4.1; Sodium 129 03/02/2017: TSH 2.070  Recent Lipid Panel No results found for: CHOL, TRIG, HDL, CHOLHDL, VLDL, LDLCALC, LDLDIRECT  Physical Exam:    VS:  BP (!) 152/76 (BP Location: Right Arm, Patient Position: Sitting, Cuff Size: Normal)   Pulse 93   Ht 6' (1.829 m)   Wt 171 lb 9.6 oz (77.8 kg)   SpO2 98%   BMI 23.27 kg/m     Wt Readings from Last 3 Encounters:  10/25/17 171 lb 9.6 oz (77.8 kg)  10/10/17 181 lb (82.1 kg)  05/30/17 197 lb (89.4 kg)     GEN:  Well nourished, well developed in no acute distress HEENT: Normal NECK: No JVD; No carotid bruits LYMPHATICS: No lymphadenopathy CARDIAC: RRR, no murmurs, rubs, gallops RESPIRATORY:  Clear to auscultation without rales, wheezing or rhonchi  ABDOMEN: Soft, non-tender, non-distended MUSCULOSKELETAL:  No edema; No deformity  SKIN: Warm and dry NEUROLOGIC:  Alert and oriented x 3 PSYCHIATRIC:  Normal affect    Signed, Shirlee More, MD  10/25/2017 4:50 PM    [facility]

## 2017-10-24 NOTE — H&P (View-Only) (Signed)
Cardiology Office Note:    Date:  10/25/2017   ID:  Brian Pratt, DOB 1945-08-27, MRN [id number]  PCP:  Kathreen Devoid, PA-C  Cardiologist:  Shirlee More, MD    Referring MD: [place] Scrape*    ASSESSMENT:    1. Typical atrial flutter ([place])   2. Hypertensive heart disease, unspecified whether heart failure present   3. Abnormal myocardial perfusion study   4. Hyperlipidemia, unspecified hyperlipidemia type   5. PVD (peripheral vascular disease) ([place])    PLAN:    In order of problems listed above:  1. Improved in sinus rhythm today 2. Stable blood pressure at target continue current treatment 3. In view of his abnormal myocardial perfusion study with ischemic defect reduced EF I asked him to undergo coronary angiography and likely percutaneous intervention.  He has a renal mass and is pending cryoablation.  We discussed timing and I think we should deal with his cardiac issues first as he has not had acute coronary syndrome and he tells me that the mass is stable on serial imaging. 4. Continue his statin 5. Managed by vascular surgery   Next appointment: One month   Medication Adjustments/Labs and Tests Ordered: Current medicines are reviewed at length with the patient today.  Concerns regarding medicines are outlined above.  No orders of the defined types were placed in this encounter.  No orders of the defined types were placed in this encounter.   Chief Complaint  Patient presents with  . New Patient (Initial Visit)    abnormal MPI  . Atrial Flutter  . Hypertension  . Hyperlipidemia  . PAD    History of Present Illness:    Brian Pratt is a 72 y.o. male with a hx of atrial flutter,[place] 1 second degree AV block, hypertension, hyperlipidemia renal mass, PVD with prior angioplasty of right external iliac artery, bilateral femoral artery endarterectomy, and right to left fem-fem bypass graft on 02-05-16 last seen 03/02/17. Compliance with diet,  lifestyle and medications: Yes  He tells me that anticoagulant was stopped because of frequent falls with neuropathy and trauma.  He has no cardiovascular complaints of chest pain edema orthopnea palpitations syncope or TIA but he short of breath walking indoors.  He was unaware of the results of his myocardial perfusion study I reviewed the findings suggesting significant CAD high risk markers options benefits and risk informed consent was obtained and scheduled for coronary angiography.  I cannot see the records but he tells me his renal mass is stable and intervention is not urgent.  He has no dye allergy or contraindication to dual antiplatelet therapy Past Medical History:  Diagnosis Date  . Arthritis   . Atrial flutter ([place])   . Complication of anesthesia    slow to awaken to awaken and confusion after fem /pop bypass 02-05-2016  . Concussion    age 71  . Depression    history of years ago about 20 years ago  . Diabetes mellitus without complication ([place])    type 2  . GERD (gastroesophageal reflux disease)   . Hyperlipidemia   . Hypertension   . Neuromuscular disorder ([facility])    neuropathy  . PAD (peripheral artery disease) ([place]) 02/2016   R->L fem-fem bpg  . PTSD (post-traumatic stress disorder)     Past Surgical History:  Procedure Laterality Date  . ANGIOPLASTY ILLIAC ARTERY  02/05/2016   Procedure: Right Balloon ANGIOPLASTY External  ILIAC ARTERY;  Surgeon: Rosetta Posner, MD;  Location:  [facility];  Service: Vascular;;  . APPENDECTOMY  yrs ago   ruptured  . ENDARTERECTOMY FEMORAL Right 02/05/2016   Procedure: Bilateral ENDARTERECTOMY FEMORAL;  Surgeon: Rosetta Posner, MD;  Location: [facility];  Service: Vascular;  Laterality: Right;  . EYE SURGERY Bilateral    cataracts removed  . FEMORAL-FEMORAL BYPASS GRAFT Bilateral 02/05/2016   Procedure: BYPASS GRAFT RIGHT FEMORAL-LEFT FEMORAL ARTERY;  Surgeon: Rosetta Posner, MD;  Location: [facility];  Service: Vascular;  Laterality: Bilateral;  .  INTRAOPERATIVE ARTERIOGRAM  02/05/2016   Procedure: INTRA OPERATIVE ARTERIOGRAM;  Surgeon: Rosetta Posner, MD;  Location: [facility];  Service: Vascular;;  . IR RADIOLOGIST EVAL & MGMT  12/20/2016  . PERIPHERAL VASCULAR CATHETERIZATION N/A 11/16/2015   Procedure: Abdominal Aortogram w/Lower Extremity;  Surgeon: Angelia Mould, MD;  Location: [facility];  Service: Cardiovascular;  Laterality: N/A;  . REPLACEMENT TOTAL KNEE Left 2001    Current Medications: Current Meds  Medication Sig  . albuterol-ipratropium (COMBIVENT) 18-103 MCG/ACT inhaler Inhale 2 puffs into the lungs 2 (two) times daily.  . Ascorbic Acid (VITAMIN C) 1000 MG tablet Take 1,000 mg by mouth daily.  . Aspirin-Acetaminophen-Caffeine (EXCEDRIN PO) Take 1 tablet by mouth as needed (PAIN).  Marland Kitchen atorvastatin (LIPITOR) 80 MG tablet Take 80 mg by mouth at bedtime.  . Continuous Blood Gluc Sensor (FREESTYLE LIBRE 14 DAY SENSOR) MISC Inject into the skin.  . ferrous sulfate 325 (65 FE) MG tablet Take 325 mg by mouth daily.   Marland Kitchen gabapentin (NEURONTIN) 300 MG capsule Take 900 mg by mouth every 4 (four) hours.   Marland Kitchen glimepiride (AMARYL) 4 MG tablet Take 4 mg by mouth daily.   . insulin aspart protamine- aspart (NOVOLOG MIX 70/30) (70-30) 100 UNIT/ML injection Inject 50 Units into the skin 2 (two) times daily.   Marland Kitchen lisinopril-hydrochlorothiazide (PRINZIDE,ZESTORETIC) 20-25 MG tablet Take 1 tablet by mouth daily.  . metFORMIN (GLUCOPHAGE) 1000 MG tablet Take 1,000 mg by mouth 2 (two) times daily with a meal.  . Multiple Vitamin (MULTIVITAMIN WITH MINERALS) TABS tablet Take 1 tablet by mouth daily.  . Omega-3 1000 MG CAPS Take 1,000 capsules by mouth daily.   Marland Kitchen omeprazole (PRILOSEC) 20 MG capsule Take 20 mg by mouth daily.  . polyvinyl alcohol (LIQUIFILM TEARS) 1.4 % ophthalmic solution Place 1 drop into both eyes as needed for dry eyes.   Marland Kitchen rOPINIRole (REQUIP) 0.25 MG tablet Take 0.25 mg by mouth at bedtime.   . sildenafil (VIAGRA) 100  MG tablet Take 100 mg by mouth daily as needed for erectile dysfunction.     Allergies:   Patient has no known allergies.   Social History   Socioeconomic History  . Marital status: Single    Spouse name: Not on file  . Number of children: 3  . Years of education: Not on file  . Highest education level: Not on file  Occupational History    Comment: Retired  Scientific laboratory technician  . Financial resource strain: Not on file  . Food insecurity:    Worry: Not on file    Inability: Not on file  . Transportation needs:    Medical: Not on file    Non-medical: Not on file  Tobacco Use  . Smoking status: Current Every Day Smoker    Packs/day: 1.50    Years: 40.00    Pack years: 60.00    Types: Cigarettes  . Smokeless tobacco: Never Used  Substance and Sexual Activity  . Alcohol use:  No  . Drug use: No  . Sexual activity: Never  Lifestyle  . Physical activity:    Days per week: Not on file    Minutes per session: Not on file  . Stress: Not on file  Relationships  . Social connections:    Talks on phone: Not on file    Gets together: Not on file    Attends religious service: Not on file    Active member of club or organization: Not on file    Attends meetings of clubs or organizations: Not on file    Relationship status: Not on file  Other Topics Concern  . Not on file  Social History Narrative  . Not on file     Family History: The patient's family history includes Diabetes in his mother; Heart failure in his mother; Stroke in his father. ROS:   Please see the history of present illness.    All other systems reviewed and are negative.  EKGs/Labs/Other Studies Reviewed:    The following studies were reviewed today:   EKG:  EKG ordered today.  The ekg ordered today demonstrates sinus rhythm APCs brief run of atrial tachycardia  Myocardial perfusion study 10/10/2017 shows an ejection fraction of 41% with an ischemic defect moderate in the basal anterior mid anterior apical  anterior apical lateral and apex.  He was performed pharmacologically.  Carotid duplex showed 1 to 39% bilateral ICA stenosis  Echocardiogram 03/15/2017 showed mild LVH at that time ejection fraction was normal aortic root was dilated 39 mm moderate left atrial enlargement  Recent Labs: 03/01/2017: BUN 20; Creatinine, Ser 1.14; Hemoglobin 8.5; Platelets 225; Potassium 4.1; Sodium 129 03/02/2017: TSH 2.070  Recent Lipid Panel No results found for: CHOL, TRIG, HDL, CHOLHDL, VLDL, LDLCALC, LDLDIRECT  Physical Exam:    VS:  BP (!) 152/76 (BP Location: Right Arm, Patient Position: Sitting, Cuff Size: Normal)   Pulse 93   Ht 6' (1.829 m)   Wt 171 lb 9.6 oz (77.8 kg)   SpO2 98%   BMI 23.27 kg/m     Wt Readings from Last 3 Encounters:  10/25/17 171 lb 9.6 oz (77.8 kg)  10/10/17 181 lb (82.1 kg)  05/30/17 197 lb (89.4 kg)     GEN:  Well nourished, well developed in no acute distress HEENT: Normal NECK: No JVD; No carotid bruits LYMPHATICS: No lymphadenopathy CARDIAC: RRR, no murmurs, rubs, gallops RESPIRATORY:  Clear to auscultation without rales, wheezing or rhonchi  ABDOMEN: Soft, non-tender, non-distended MUSCULOSKELETAL:  No edema; No deformity  SKIN: Warm and dry NEUROLOGIC:  Alert and oriented x 3 PSYCHIATRIC:  Normal affect    Signed, Shirlee More, MD  10/25/2017 4:50 PM    [facility]

## 2017-10-25 ENCOUNTER — Encounter: Payer: Self-pay | Admitting: Cardiology

## 2017-10-25 ENCOUNTER — Ambulatory Visit (INDEPENDENT_AMBULATORY_CARE_PROVIDER_SITE_OTHER): Payer: Medicare Other | Admitting: Cardiology

## 2017-10-25 VITALS — BP 152/76 | HR 93 | Ht 72.0 in | Wt 171.6 lb

## 2017-10-25 DIAGNOSIS — E785 Hyperlipidemia, unspecified: Secondary | ICD-10-CM

## 2017-10-25 DIAGNOSIS — R9439 Abnormal result of other cardiovascular function study: Secondary | ICD-10-CM | POA: Diagnosis not present

## 2017-10-25 DIAGNOSIS — I119 Hypertensive heart disease without heart failure: Secondary | ICD-10-CM | POA: Diagnosis not present

## 2017-10-25 DIAGNOSIS — I483 Typical atrial flutter: Secondary | ICD-10-CM

## 2017-10-25 DIAGNOSIS — I739 Peripheral vascular disease, unspecified: Secondary | ICD-10-CM

## 2017-10-25 MED ORDER — ASPIRIN EC 81 MG PO TBEC: 81.0000 mg | DELAYED_RELEASE_TABLET | Freq: Every day | ORAL | 3 refills | Status: DC

## 2017-10-25 NOTE — Patient Instructions (Addendum)
Medication Instructions:  Your physician has recommended you make the following change in your medication:  Start: Aspirin 81 mg daily.      Labwork: Your physician recommends that you return for lab work today: CBC, BMP.  Testing/Procedures: Your physician has requested that you have a cardiac catheterization. Cardiac catheterization is used to diagnose and/or treat various heart conditions. Doctors may recommend this procedure for a number of different reasons. The most common reason is to evaluate chest pain. Chest pain can be a symptom of coronary artery disease (CAD), and cardiac catheterization can show whether plaque is narrowing or blocking your heart's arteries. This procedure is also used to evaluate the valves, as well as measure the blood flow and oxygen levels in different parts of your heart. For further information please visit [email or web address]. Please follow instruction sheet, as given.      Brian Pratt [place] [phone] Dept: [phone] Loc: [place]  10/25/2017  You are scheduled for a Cardiac Catheterization on Friday, August 23 with Dr. Glenetta Hew.  1. Please arrive at the [facility] (Main Entrance A) at [facility]: [address] [place], [place] [address] at 10:00 AM (This time is two hours before your procedure to ensure your preparation). Free valet parking service is available.   Special note: Every effort is made to have your procedure done on time. Please understand that emergencies sometimes delay scheduled procedures.  2. Diet: Do not eat solid foods after midnight.  The patient may have clear liquids until 5am upon the day of the procedure.  3. Labs: You will need to have blood drawn today.  4. Medication instructions in preparation for your procedure:  Night before: Take only 25 units of Novolog mix. Do not take any  insulin the day of the procedure  Day of procedure Hold: Glimepiride, Metformin, insulin, and lisinopril-hctz the day of the procedure.      On the morning of your procedure, take your Aspirin and any morning medicines NOT listed above.  You may use sips of water.  5. Plan for one night stay--bring personal belongings. 6. Bring a current list of your medications and current insurance cards. 7. You MUST have a responsible person to drive you home. 8. Someone MUST be with you the first 24 hours after you arrive home or your discharge will be delayed. 9. Please wear clothes that are easy to get on and off and wear slip-on shoes.  Thank you for allowing [place] to care for you!   -- [facility]     Follow-Up:  Your physician recommends that you schedule a follow-up appointment in: 6 weeks.    Any Other Special Instructions Will Be Listed Below (If Applicable).     If you need a refill on your cardiac medications before your next appointment, please call your pharmacy.

## 2017-10-26 ENCOUNTER — Telehealth: Payer: Self-pay | Admitting: *Deleted

## 2017-10-26 LAB — BASIC METABOLIC PANEL
BUN/Creatinine Ratio: 11 (ref 10–24)
BUN: 16 mg/dL (ref 8–27)
CHLORIDE: 95 mmol/L — AB (ref 96–106)
CO2: 26 mmol/L (ref 20–29)
Calcium: 9.3 mg/dL (ref 8.6–10.2)
Creatinine, Ser: 1.4 mg/dL — ABNORMAL HIGH (ref 0.76–1.27)
GFR calc non Af Amer: 50 mL/min/{1.73_m2} — ABNORMAL LOW (ref >59)
GFR, EST AFRICAN AMERICAN: 58 mL/min/{1.73_m2} — AB (ref >59)
GLUCOSE: 165 mg/dL — AB (ref 65–99)
Potassium: 4.5 mmol/L (ref 3.5–5.2)
SODIUM: 134 mmol/L (ref 134–144)

## 2017-10-26 LAB — CBC
Hematocrit: 38.9 % (ref 37.5–51.0)
Hemoglobin: 12.8 g/dL — ABNORMAL LOW (ref 13.0–17.7)
MCH: 25.7 pg — ABNORMAL LOW (ref 26.6–33.0)
MCHC: 32.9 g/dL (ref 31.5–35.7)
MCV: 78 fL — AB (ref 79–97)
PLATELETS: 215 10*3/uL (ref 150–450)
RBC: 4.99 x10E6/uL (ref 4.14–5.80)
RDW: 21.5 % — ABNORMAL HIGH (ref 12.3–15.4)
WBC: 3.6 10*3/uL (ref 3.4–10.8)

## 2017-10-26 NOTE — Telephone Encounter (Signed)
Pt contacted pre-catheterization scheduled at [facility] for: Friday October 27, 2017 12 noon Verified arrival time and place: [facility] Entrance A at: 10 AM  No solid food after midnight prior to cath, clear liquids until 5 AM day of procedure. Verified no known drug allergies  Hold: Metformin AM of procedure and 48 hours post procedure. Glipizide AM of procedure. Lisinopril-HCTZ AM of procedure. Insulin AM of procedure. 1/2 usual Insulin PM prior to procedure. Viagra-until post procedure.  Except hold medications AM meds can be  taken pre-cath with sip of water including: ASA 81 mg  Confirmed patient has responsible person to drive home post procedure and for 24 hours after you arrive home: yes

## 2017-10-27 ENCOUNTER — Other Ambulatory Visit: Payer: Self-pay

## 2017-10-27 ENCOUNTER — Ambulatory Visit (HOSPITAL_COMMUNITY)
Admission: RE | Admit: 2017-10-27 | Discharge: 2017-10-27 | Disposition: A | Discharge status: Home / Self Care | Payer: Medicare Other | Source: Ambulatory Visit | Attending: Cardiology | Admitting: Cardiology

## 2017-10-27 ENCOUNTER — Encounter (HOSPITAL_COMMUNITY)
Admission: RE | Disposition: A | Discharge status: Home / Self Care | Payer: Self-pay | Source: Ambulatory Visit | Attending: Cardiology

## 2017-10-27 DIAGNOSIS — I119 Hypertensive heart disease without heart failure: Secondary | ICD-10-CM | POA: Insufficient documentation

## 2017-10-27 DIAGNOSIS — Z794 Long term (current) use of insulin: Secondary | ICD-10-CM | POA: Diagnosis not present

## 2017-10-27 DIAGNOSIS — F329 Major depressive disorder, single episode, unspecified: Secondary | ICD-10-CM | POA: Insufficient documentation

## 2017-10-27 DIAGNOSIS — E1151 Type 2 diabetes mellitus with diabetic peripheral angiopathy without gangrene: Secondary | ICD-10-CM | POA: Diagnosis not present

## 2017-10-27 DIAGNOSIS — I6523 Occlusion and stenosis of bilateral carotid arteries: Secondary | ICD-10-CM | POA: Diagnosis not present

## 2017-10-27 DIAGNOSIS — E785 Hyperlipidemia, unspecified: Secondary | ICD-10-CM | POA: Insufficient documentation

## 2017-10-27 DIAGNOSIS — I483 Typical atrial flutter: Secondary | ICD-10-CM

## 2017-10-27 DIAGNOSIS — F4310 Post-traumatic stress disorder, unspecified: Secondary | ICD-10-CM | POA: Insufficient documentation

## 2017-10-27 DIAGNOSIS — F17210 Nicotine dependence, cigarettes, uncomplicated: Secondary | ICD-10-CM | POA: Diagnosis not present

## 2017-10-27 DIAGNOSIS — I2582 Chronic total occlusion of coronary artery: Secondary | ICD-10-CM | POA: Diagnosis not present

## 2017-10-27 DIAGNOSIS — I2510 Atherosclerotic heart disease of native coronary artery without angina pectoris: Secondary | ICD-10-CM

## 2017-10-27 DIAGNOSIS — M1990 Unspecified osteoarthritis, unspecified site: Secondary | ICD-10-CM | POA: Insufficient documentation

## 2017-10-27 DIAGNOSIS — N2889 Other specified disorders of kidney and ureter: Secondary | ICD-10-CM | POA: Insufficient documentation

## 2017-10-27 DIAGNOSIS — R9439 Abnormal result of other cardiovascular function study: Secondary | ICD-10-CM | POA: Diagnosis present

## 2017-10-27 DIAGNOSIS — E1140 Type 2 diabetes mellitus with diabetic neuropathy, unspecified: Secondary | ICD-10-CM | POA: Diagnosis not present

## 2017-10-27 DIAGNOSIS — I480 Paroxysmal atrial fibrillation: Secondary | ICD-10-CM | POA: Diagnosis not present

## 2017-10-27 DIAGNOSIS — K219 Gastro-esophageal reflux disease without esophagitis: Secondary | ICD-10-CM | POA: Diagnosis not present

## 2017-10-27 DIAGNOSIS — I2584 Coronary atherosclerosis due to calcified coronary lesion: Secondary | ICD-10-CM | POA: Diagnosis not present

## 2017-10-27 HISTORY — PX: LEFT HEART CATH AND CORONARY ANGIOGRAPHY: CATH118249

## 2017-10-27 LAB — GLUCOSE, CAPILLARY
GLUCOSE-CAPILLARY: 229 mg/dL — AB (ref 70–99)
GLUCOSE-CAPILLARY: 167 mg/dL — AB (ref 70–99)

## 2017-10-27 SURGERY — LEFT HEART CATH AND CORONARY ANGIOGRAPHY
Anesthesia: LOCAL

## 2017-10-27 MED ORDER — HEPARIN SODIUM (PORCINE) 1000 UNIT/ML IJ SOLN
INTRAMUSCULAR | Status: DC | PRN
  Administered 2017-10-27: 4000 [IU] via INTRAVENOUS

## 2017-10-27 MED ORDER — HEPARIN (PORCINE) IN NACL 1000-0.9 UT/500ML-% IV SOLN
INTRAVENOUS | Status: AC
  Filled 2017-10-27: qty 1000

## 2017-10-27 MED ORDER — ONDANSETRON HCL 4 MG/2ML IJ SOLN: 4.0000 mg | Freq: Four times a day (QID) | INTRAMUSCULAR | Status: DC | PRN

## 2017-10-27 MED ORDER — SODIUM CHLORIDE 0.9 % IV SOLN: 250.0000 mL | INTRAVENOUS | Status: DC | PRN

## 2017-10-27 MED ORDER — SODIUM CHLORIDE 0.9% FLUSH: 3.0000 mL | INTRAVENOUS | Status: DC | PRN

## 2017-10-27 MED ORDER — VERAPAMIL HCL 2.5 MG/ML IV SOLN
INTRAVENOUS | Status: AC
  Filled 2017-10-27: qty 2

## 2017-10-27 MED ORDER — SODIUM CHLORIDE 0.9 % WEIGHT BASED INFUSION
3.0000 mL/kg/h | INTRAVENOUS | Status: AC
  Administered 2017-10-27: 3 mL/kg/h via INTRAVENOUS

## 2017-10-27 MED ORDER — MIDAZOLAM HCL 2 MG/2ML IJ SOLN
INTRAMUSCULAR | Status: AC
  Filled 2017-10-27: qty 2

## 2017-10-27 MED ORDER — FENTANYL CITRATE (PF) 100 MCG/2ML IJ SOLN
INTRAMUSCULAR | Status: DC | PRN
  Administered 2017-10-27: 25 ug via INTRAVENOUS

## 2017-10-27 MED ORDER — SODIUM CHLORIDE 0.9% FLUSH: 3.0000 mL | Freq: Two times a day (BID) | INTRAVENOUS | Status: DC

## 2017-10-27 MED ORDER — SODIUM CHLORIDE 0.9 % IV SOLN: INTRAVENOUS | Status: AC

## 2017-10-27 MED ORDER — SODIUM CHLORIDE 0.9 % WEIGHT BASED INFUSION: 1.0000 mL/kg/h | INTRAVENOUS | Status: DC

## 2017-10-27 MED ORDER — MIDAZOLAM HCL 2 MG/2ML IJ SOLN
INTRAMUSCULAR | Status: DC | PRN
  Administered 2017-10-27: 2 mg via INTRAVENOUS

## 2017-10-27 MED ORDER — HEPARIN SODIUM (PORCINE) 1000 UNIT/ML IJ SOLN
INTRAMUSCULAR | Status: AC
  Filled 2017-10-27: qty 1

## 2017-10-27 MED ORDER — IOHEXOL 350 MG/ML SOLN
INTRAVENOUS | Status: DC | PRN
  Administered 2017-10-27: 75 mL via INTRA_ARTERIAL

## 2017-10-27 MED ORDER — HEPARIN (PORCINE) IN NACL 1000-0.9 UT/500ML-% IV SOLN
INTRAVENOUS | Status: DC | PRN
  Administered 2017-10-27: 500 mL

## 2017-10-27 MED ORDER — LIDOCAINE HCL (PF) 1 % IJ SOLN
INTRAMUSCULAR | Status: AC
  Filled 2017-10-27: qty 30

## 2017-10-27 MED ORDER — VERAPAMIL HCL 2.5 MG/ML IV SOLN
INTRAVENOUS | Status: DC | PRN
  Administered 2017-10-27: 10 mL via INTRA_ARTERIAL

## 2017-10-27 MED ORDER — ASPIRIN 81 MG PO CHEW: 81.0000 mg | CHEWABLE_TABLET | ORAL | Status: DC

## 2017-10-27 MED ORDER — ACETAMINOPHEN 325 MG PO TABS: 650.0000 mg | ORAL_TABLET | ORAL | Status: DC | PRN

## 2017-10-27 MED ORDER — FENTANYL CITRATE (PF) 100 MCG/2ML IJ SOLN
INTRAMUSCULAR | Status: AC
  Filled 2017-10-27: qty 2

## 2017-10-27 MED ORDER — LIDOCAINE HCL (PF) 1 % IJ SOLN
INTRAMUSCULAR | Status: DC | PRN
  Administered 2017-10-27: 2 mL via INTRADERMAL

## 2017-10-27 SURGICAL SUPPLY — 10 items
CATH INFINITI 5FR ANG PIGTAIL (CATHETERS) ×2 IMPLANT
CATH OPTITORQUE TIG 4.0 5F (CATHETERS) ×2 IMPLANT
DEVICE RAD COMP TR BAND LRG (VASCULAR PRODUCTS) ×2 IMPLANT
GLIDESHEATH SLEND A-KIT 6F 22G (SHEATH) ×2 IMPLANT
GUIDEWIRE INQWIRE 1.5J.035X260 (WIRE) ×1 IMPLANT
INQWIRE 1.5J .035X260CM (WIRE) ×2
KIT HEART LEFT (KITS) ×2 IMPLANT
PACK CARDIAC CATHETERIZATION (CUSTOM PROCEDURE TRAY) ×2 IMPLANT
TRANSDUCER W/STOPCOCK (MISCELLANEOUS) ×2 IMPLANT
TUBING CIL FLEX 10 FLL-RA (TUBING) ×2 IMPLANT

## 2017-10-27 NOTE — Progress Notes (Signed)
   10/27/17 1043  Clinical Encounter Type  Visited With Patient and family together  Visit Type Initial;Pre-op  Referral From Patient;Nurse  Consult/Referral To Chaplain  Spiritual Encounters  Spiritual Needs Prayer   Received a call from [place] per the patient to see a Chaplain before his procedure.  Visited with the patient and his sister, Arbie Cookey.  Patient told story about his service in the [employer] during [place] and how he helps [place] now dealing with paperwork.  We prayed together.  Will follow and support as needed. Chaplain Katherene Ponto

## 2017-10-27 NOTE — Progress Notes (Signed)
Chaplain services called and requested visit. Per pt request.

## 2017-10-27 NOTE — Discharge Instructions (Signed)
NO METFORMIN/GLUCOPHAGE FOR 2 DAYS ° ° ° °Radial Site Care °Refer to this sheet in the next few weeks. These instructions provide you with information about caring for yourself after your procedure. Your health care provider may also give you more specific instructions. Your treatment has been planned according to current medical practices, but problems sometimes occur. Call your health care provider if you have any problems or questions after your procedure. °What can I expect after the procedure? °After your procedure, it is typical to have the following: °· Bruising at the radial site that usually fades within 1-2 weeks. °· Blood collecting in the tissue (hematoma) that may be painful to the touch. It should usually decrease in size and tenderness within 1-2 weeks. ° °Follow these instructions at home: °· Take medicines only as directed by your health care provider. °· You may shower 24-48 hours after the procedure or as directed by your health care provider. Remove the bandage (dressing) and gently wash the site with plain soap and water. Pat the area dry with a clean towel. Do not rub the site, because this may cause bleeding. °· Do not take baths, swim, or use a hot tub until your health care provider approves. °· Check your insertion site every day for redness, swelling, or drainage. °· Do not apply powder or lotion to the site. °· Do not flex or bend the affected arm for 24 hours or as directed by your health care provider. °· Do not push or pull heavy objects with the affected arm for 24 hours or as directed by your health care provider. °· Do not lift over 10 lb (4.5 kg) for 5 days after your procedure or as directed by your health care provider. °· Ask your health care provider when it is okay to: °? Return to work or school. °? Resume usual physical activities or sports. °? Resume sexual activity. °· Do not drive home if you are discharged the same day as the procedure. Have someone else drive  you. °· You may drive 24 hours after the procedure unless otherwise instructed by your health care provider. °· Do not operate machinery or power tools for 24 hours after the procedure. °· If your procedure was done as an outpatient procedure, which means that you went home the same day as your procedure, a responsible adult should be with you for the first 24 hours after you arrive home. °· Keep all follow-up visits as directed by your health care provider. This is important. °Contact a health care provider if: °· You have a fever. °· You have chills. °· You have increased bleeding from the radial site. Hold pressure on the site. °Get help right away if: °· You have unusual pain at the radial site. °· You have redness, warmth, or swelling at the radial site. °· You have drainage (other than a small amount of blood on the dressing) from the radial site. °· The radial site is bleeding, and the bleeding does not stop after 30 minutes of holding steady pressure on the site. °· Your arm or hand becomes pale, cool, tingly, or numb. °This information is not intended to replace advice given to you by your health care provider. Make sure you discuss any questions you have with your health care provider. °Document Released: 03/26/2010 Document Revised: 07/30/2015 Document Reviewed: 09/09/2013 °Elsevier Interactive Patient Education © 2018 Elsevier Inc[employer] °Moderate Conscious Sedation, Adult, Care After °These instructions provide you with information about caring for yourself after your   procedure. Your health care provider may also give you more specific instructions. Your treatment has been planned according to current medical practices, but problems sometimes occur. Call your health care provider if you have any problems or questions after your procedure. °What can I expect after the procedure? °After your procedure, it is common: °· To feel sleepy for several hours. °· To feel clumsy and have poor balance for several  hours. °· To have poor judgment for several hours. °· To vomit if you eat too soon. ° °Follow these instructions at home: °For at least 24 hours after the procedure: ° °· Do not: °? Participate in activities where you could fall or become injured. °? Drive. °? Use heavy machinery. °? Drink alcohol. °? Take sleeping pills or medicines that cause drowsiness. °? Make important decisions or sign legal documents. °? Take care of children on your own. °· Rest. °Eating and drinking °· Follow the diet recommended by your health care provider. °· If you vomit: °? Drink water, juice, or soup when you can drink without vomiting. °? Make sure you have little or no nausea before eating solid foods. °General instructions °· Have a responsible adult stay with you until you are awake and alert. °· Take over-the-counter and prescription medicines only as told by your health care provider. °· If you smoke, do not smoke without supervision. °· Keep all follow-up visits as told by your health care provider. This is important. °Contact a health care provider if: °· You keep feeling nauseous or you keep vomiting. °· You feel light-headed. °· You develop a rash. °· You have a fever. °Get help right away if: °· You have trouble breathing. °This information is not intended to replace advice given to you by your health care provider. Make sure you discuss any questions you have with your health care provider. °Document Released: 12/12/2012 Document Revised: 07/27/2015 Document Reviewed: 06/13/2015 °Elsevier Interactive Patient Education © 2018 Elsevier Inc. ° °

## 2017-10-27 NOTE — Brief Op Note (Signed)
  BRIEF CARDIAC CATH REPORT  10/27/2017  12:58 PM  PATIENT:  KEEGHAN BIALY  72 y.o. male with significant multivessel peripheral artery disease status post bilateral vascular bypass surgeries who has had paroxysmal atrial fibrillation/flutter.  As part of his evaluation for preoperative assessment for possible renal mass removal, he underwent a nuclear stress test that was read as at least moderate risk with a significant anterior wall perfusion defect consistent with ischemia.  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  Best option is bypass surgery.  Heavily calcified initial tapering of the LAD at first diagonal branch from a 4 mm vessel down to a roughly 2 mm vessel when he gives off a heavily calcified first diagonal branch that has diffuse 99% calcified disease.  The LAD beyond this branch has several focal 80-90% stenoses in the midst of roughly 70 to 80% stenosis to the mid vessel.  There is evidence of a second diagonal branch that fills minimally and is essentially occluded proximally.  The LAD itself and wraps around the apex somewhat normalizing in size by the mid vessel.  The entire proximal third of the LAD is calcified  The circumflex is actually relatively normal, large caliber vessel that bifurcates into 2 OM branches with the inferior branch having a ostial 60-70 % stenosis.  However the rest the vessel is relatively free of disease.  RCA normal caliber vessel with proximal irregular 95% stenosis followed by eccentric 85% mid RCA stenosis.  There is moderate disease in the distal RCA followed by a 90% focal lesion in the posterior groove branch prior to the RPL2  Relatively normal LVEDP.  Normal EF by echo, but EF 41% by nuclear   PROCEDURE:  Procedure(s): LEFT HEART CATH AND CORONARY ANGIOGRAPHY  (N/A)   Right radial access with 5 French sheath using micropuncture Angiocath technique.  5 [place] TIG 4.0 catheter used for left and right coronary angiography and then to cross the aortic valve.    This catheter was exchanged for angled pigtail catheter for LV hemodynamics.    The catheter was then removed to [facility] the body over wire without application.  SURGEON:  Surgeon(s) and Role:    * Leonie Man, MD - Primary  ANESTHESIA:   local and IV sedation; 4 mL subcu lidocaine; 2 mg IV Versed, 25 by gram IV fentanyl.  Monitored throughout.  Sedation time 39 minutes.  EBL: < 50 mL   MEDICATIONS USED: 3 mg verapamil given as IA radial cocktail; 4000 units IV heparin; 75 mL contrast  TR Band: Radial sheath removed, TR band placed at 1245 hrs., 15 mL air.    DICTATION: .Note written in [place]: Discharge to home after PACU -outpatient CVTS consultation  PATIENT DISPOSITION:  PACU - hemodynamically stable.   Delay start of Pharmacological VTE agent (>24hrs) due to surgical blood loss or risk of bleeding: not applicable    Glenetta Hew, M.D., M.S. Interventional Cardiologist   Pager # [phone] Phone # [phone] [address] [place], [place] [address]

## 2017-10-27 NOTE — Interval H&P Note (Signed)
History and Physical Interval Note:  10/27/2017 11:51 AM  Ross Ludwig  has presented today for surgery, with the diagnosis of abn perfusion study, PAD.  The various methods of treatment have been discussed with the patient and family. After consideration of risks, benefits and other options for treatment, the patient has consented to  Procedure(s): LEFT HEART CATH AND CORONARY ANGIOGRAPHY (N/A) with possible PERCUTANEOUS CORONARY INTERVENTION as a surgical intervention .  The patient's history has been reviewed, patient examined, no change in status, stable for surgery.  I have reviewed the patient's chart and labs.  Questions were answered to the patient's satisfaction.    Cath Lab Visit (complete for each Cath Lab visit)  Clinical Evaluation Leading to the Procedure:   ACS: No.  Non-ACS:    Anginal Classification: No Symptoms  Anti-ischemic medical therapy: Minimal Therapy (1 class of medications)  Non-Invasive Test Results: Intermediate-risk stress test findings: cardiac mortality 1-3%/year  Prior CABG: No previous CABG   Glenetta Hew

## 2017-10-30 ENCOUNTER — Encounter (HOSPITAL_COMMUNITY): Payer: Self-pay | Admitting: Cardiology

## 2017-11-08 ENCOUNTER — Institutional Professional Consult (permissible substitution) (INDEPENDENT_AMBULATORY_CARE_PROVIDER_SITE_OTHER): Payer: Medicare Other | Admitting: Surgery

## 2017-11-08 VITALS — BP 164/86 | HR 75 | Resp 20 | Ht 72.0 in | Wt 174.0 lb

## 2017-11-08 DIAGNOSIS — I779 Disorder of arteries and arterioles, unspecified: Secondary | ICD-10-CM

## 2017-11-08 DIAGNOSIS — I2510 Atherosclerotic heart disease of native coronary artery without angina pectoris: Secondary | ICD-10-CM | POA: Diagnosis not present

## 2017-11-13 ENCOUNTER — Encounter: Payer: Self-pay | Admitting: Surgery

## 2017-11-13 NOTE — Progress Notes (Signed)
Cardiothoracic Surgery Consultation   PCP is Kathreen Devoid, PA-C Referring Provider is Leonie Man, MD Primary cardiologist is Shirlee More, MD Chief Complaint  Patient presents with  . Coronary Artery Disease    Surgical eval, Cardiac Cath 10/27/17, ECHO 03/15/17    HPI:  The patient is a 72 year old gentleman with a history of type 2 diabetes, hypertension, hyperlipidemia, atrial flutter, Mobitz type I second-degree AV block, peripheral vascular disease with prior angioplasty of the right external iliac artery and bilateral femoral endarterectomy with right to left femorofemoral bypass who also has a renal mass.  The patient was scheduled for IR ablation of the mass on 03/01/2017 but the procedure was canceled when he arrived and was found to be in atrial flutter.  He was evaluated by Dr. Stanford Breed the next day and was started on anticoagulation with apixaban with plans for a Lexiscan nuclear stress test for risk stratification.  The patient decided that he wanted to be followed up in [place] which was closer to his home and therefore his care was transferred to Dr. Bettina Gavia.  For some reason the Lexiscan did not get done until 10/10/2017.  This showed a left ventricular ejection fraction of 41%.  There were no ST segment changes during stress but there is a large defect of moderate severity in the anterior wall consistent with ischemia.  It was felt to be an intermediate risk study.  Subsequently underwent cardiac catheterization on 10/27/2017 by Dr. Ellyn Hack and this showed severe multivessel disease with heavily calcified vessels.  The LAD had a long proximal to mid 85% stenosis.  There was 95% proximal first diagonal stenosis and 99% second diagonal occlusion.  The left circumflex coronary artery has 70% ostial stenosis of a large second marginal branch. The right coronary had 95% proximal, 85% mid, and 90% distal stenosis.  The patient denies any chest pain or pressure.  He is not  very active due to multiple falls related to neuropathy and balance issues.  He does have some shortness of breath when he gets outside to walk.  He feels like his energy level has been good.  Past Medical History:  Diagnosis Date  . Arthritis   . Atrial flutter ([place])   . Complication of anesthesia    slow to awaken to awaken and confusion after fem /pop bypass 02-05-2016  . Concussion    age 89  . Depression    history of years ago about 20 years ago  . Diabetes mellitus without complication ([place])    type 2  . GERD (gastroesophageal reflux disease)   . Hyperlipidemia   . Hypertension   . Neuromuscular disorder ([facility])    neuropathy  . PAD (peripheral artery disease) ([place]) 02/2016   R->L fem-fem bpg  . PTSD (post-traumatic stress disorder)     Past Surgical History:  Procedure Laterality Date  . ANGIOPLASTY ILLIAC ARTERY  02/05/2016   Procedure: Right Balloon ANGIOPLASTY External  ILIAC ARTERY;  Surgeon: Rosetta Posner, MD;  Location: [facility];  Service: Vascular;;  . APPENDECTOMY  yrs ago   ruptured  . ENDARTERECTOMY FEMORAL Right 02/05/2016   Procedure: Bilateral ENDARTERECTOMY FEMORAL;  Surgeon: Rosetta Posner, MD;  Location: [facility];  Service: Vascular;  Laterality: Right;  . EYE SURGERY Bilateral    cataracts removed  . FEMORAL-FEMORAL BYPASS GRAFT Bilateral 02/05/2016   Procedure: BYPASS GRAFT RIGHT FEMORAL-LEFT FEMORAL ARTERY;  Surgeon: Rosetta Posner, MD;  Location: [facility];  Service: Vascular;  Laterality: Bilateral;  .  INTRAOPERATIVE ARTERIOGRAM  02/05/2016   Procedure: INTRA OPERATIVE ARTERIOGRAM;  Surgeon: Rosetta Posner, MD;  Location: [facility];  Service: Vascular;;  . IR RADIOLOGIST EVAL & MGMT  12/20/2016  . LEFT HEART CATH AND CORONARY ANGIOGRAPHY N/A 10/27/2017   Procedure: LEFT HEART CATH AND CORONARY ANGIOGRAPHY;  Surgeon: Leonie Man, MD;  Location: [facility];  Service: Cardiovascular;  Laterality: N/A;  . PERIPHERAL VASCULAR CATHETERIZATION N/A 11/16/2015    Procedure: Abdominal Aortogram w/Lower Extremity;  Surgeon: Angelia Mould, MD;  Location: [facility];  Service: Cardiovascular;  Laterality: N/A;  . REPLACEMENT TOTAL KNEE Left 2001    Family History  Problem Relation Age of Onset  . Diabetes Mother   . Heart failure Mother   . Stroke Father     Social History Social History   Tobacco Use  . Smoking status: Current Every Day Smoker    Packs/day: 1.50    Years: 40.00    Pack years: 60.00    Types: Cigarettes  . Smokeless tobacco: Never Used  Substance Use Topics  . Alcohol use: No  . Drug use: No    Current Outpatient Medications  Medication Sig Dispense Refill  . albuterol-ipratropium (COMBIVENT) 18-103 MCG/ACT inhaler Inhale 2 puffs into the lungs 2 (two) times daily.    . Ascorbic Acid (VITAMIN C) 1000 MG tablet Take 1,000 mg by mouth daily.    Marland Kitchen aspirin EC 81 MG tablet Take 1 tablet (81 mg total) by mouth daily. (Patient taking differently: Take 81 mg by mouth once. ) 90 tablet 3  . Aspirin-Acetaminophen-Caffeine (EXCEDRIN PO) Take 1 tablet by mouth daily as needed (PAIN).     Marland Kitchen atorvastatin (LIPITOR) 80 MG tablet Take 80 mg by mouth at bedtime.    . Continuous Blood Gluc Sensor (FREESTYLE LIBRE 14 DAY SENSOR) MISC Inject into the skin.    . ferrous sulfate 325 (65 FE) MG tablet Take 325 mg by mouth daily.     Marland Kitchen gabapentin (NEURONTIN) 300 MG capsule Take 900 mg by mouth every 4 (four) hours.     Marland Kitchen glimepiride (AMARYL) 4 MG tablet Take 4 mg by mouth daily.     . insulin aspart protamine- aspart (NOVOLOG MIX 70/30) (70-30) 100 UNIT/ML injection Inject 50 Units into the skin 2 (two) times daily.     Marland Kitchen lisinopril-hydrochlorothiazide (PRINZIDE,ZESTORETIC) 20-25 MG tablet Take 1 tablet by mouth daily.    . metFORMIN (GLUCOPHAGE) 1000 MG tablet Take 1,000 mg by mouth 2 (two) times daily with a meal.    . Multiple Vitamin (MULTIVITAMIN WITH MINERALS) TABS tablet Take 1 tablet by mouth daily.    . Omega-3 1000 MG  CAPS Take 1,000 capsules by mouth daily.     Marland Kitchen omeprazole (PRILOSEC) 20 MG capsule Take 20 mg by mouth daily.    . polyvinyl alcohol (LIQUIFILM TEARS) 1.4 % ophthalmic solution Place 1 drop into both eyes as needed for dry eyes.     Marland Kitchen rOPINIRole (REQUIP) 0.25 MG tablet Take 0.25 mg by mouth at bedtime.     . sildenafil (VIAGRA) 100 MG tablet Take 50 mg by mouth daily as needed for erectile dysfunction.      No current facility-administered medications for this visit.     No Known Allergies  Review of Systems  Constitutional: Negative for fatigue.       +weight loss  HENT:       Dentures  Eyes: Negative.   Respiratory: Positive for shortness of  breath. Negative for chest tightness.   Cardiovascular: Negative for chest pain, palpitations and leg swelling.  Gastrointestinal:       Reflux and difficulty swallowing  Endocrine: Negative.   Genitourinary: Negative.   Musculoskeletal: Positive for arthralgias and gait problem.  Skin: Negative.   Allergic/Immunologic: Negative.   Neurological:       Neuropathy and chronic pain in his feet.  Hematological: Negative.   Psychiatric/Behavioral:       Depression and PTSD    BP (!) 164/86   Pulse 75   Resp 20   Ht 6' (1.829 m)   Wt 174 lb (78.9 kg)   SpO2 100% Comment: RA  BMI 23.60 kg/m  Physical Exam  Constitutional: He is oriented to person, place, and time.  Thin elderly gentleman in no distress  HENT:  Head: Normocephalic and atraumatic.  Mouth/Throat: Oropharynx is clear and moist.  Eyes: Pupils are equal, round, and reactive to light. Conjunctivae and EOM are normal.  Neck: Normal range of motion. Neck supple. No JVD present. No thyromegaly present.  Cardiovascular: Normal rate, regular rhythm and normal heart sounds.  No murmur heard. Pulmonary/Chest: Effort normal and breath sounds normal. No respiratory distress.  Abdominal: Soft. Bowel sounds are normal. He exhibits no distension. There is no tenderness.    Musculoskeletal: Normal range of motion. He exhibits no edema.  Lymphadenopathy:    He has no cervical adenopathy.  Neurological: He is alert and oriented to person, place, and time.  Skin: Skin is warm and dry.  Psychiatric: He has a normal mood and affect.     Diagnostic Tests:  Result Notes for MYOCARDIAL PERFUSION IMAGING  Study Highlights     The left ventricular ejection fraction is moderately decreased (30-44%).  Nuclear stress EF: 41%.  No T wave inversion was noted during stress.  There was no ST segment deviation noted during stress.  Defect 1: There is a large defect of moderate severity.  Findings consistent with ischemia.  This is an intermediate risk study.   Large size, moderate intensity reversible (SDS 8) anterior perfusion defect suggestive of ischemia. LVEF 41% with global hypokinesis. This is an intermediate risk study. Clinical correlation is advised.    LEFT HEART CATH AND CORONARY ANGIOGRAPHY  Conclusion     Prox RCA lesion is 95% stenosed. Prox RCA to Mid RCA lesion is 85% stenosed. Dist RCA lesion is 40% stenosed. Post Atrio lesion is 90% stenosed.  Ost LAD to Mid LAD lesion is 85% stenosed with 99% stenosed side branch in Ost 2nd Diag to 2nd Diag.  Ost 1st Diag lesion is 95% stenosed. 2nd Diag lesion is 100% stenosed.  Ost 3rd Mrg lesion is 70% stenosed.  LV end diastolic pressure is mildly elevated.  There is no aortic valve stenosis.   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  Best option is bypass surgery.  As the patient has been relatively stable from a hemodynamic and symptomatic standpoint, he is stable for discharge home pending scheduling of CVTS consultation.  Continue aggressive risk factor modification  Recommend Aspirin 36m daily for severe MV CAD.   Indications   Abnormal nuclear stress test [R94.39 (ICD-10-CM)]  Preoperative cardiovascular examination [Z01.810 (ICD-10-CM)]  Procedural  Details/Technique   Technical Details PCP: HKathreen Devoid PA-C  Cardiologist: BShirlee More MD   LRoss [id number]. male with significant multivessel peripheral artery disease status post bilateral vascular bypass surgeries who has had paroxysmal atrial fibrillation/flutter. As part of his evaluation for  preoperative assessment for possible renal mass removal, he underwent a nuclear stress test that was read as at least moderate risk with a significant anterior wall perfusion defect consistent with ischemia. He is therefore referred for invasive evaluation with cardiac catheterization and possible PCI.  Time Out: Verified patient identification, verified procedure, site/side was marked, verified correct patient position, special equipment/implants available, medications/allergies/relevent history reviewed, required imaging and test results available. Performed. Consent Signed.  Access:  * RIGHT Radial Artery: 6 Fr sheath -- Seldinger technique using Angiocath Micropuncture Kit * 10 mL radial cocktail IA; 4000 Units IV Heparin  Left Heart Catheterization: 5 Fr Catheters advanced or exchanged over a J-wire under direct fluoroscopic guidance into the ascending aorta; TIG 4.0 catheter advanced first.  * Left Coronary Artery Cineangiography: TIG 4.0 Catheter  * Right Coronary Artery Cineangiography: TIG 4.0 Catheter  * LV Hemodynamics (LV Gram): Aortic Valve crossed with TIG 4.0 Catheter  - After completion of angiography, the catheter was removed completely out of the body over wire without complication.  Radial Sheath(s) removed in the Cath Lab with TR Band place for hemostasis.   TR Band: 1245 Hours; 15 mL air  MEDICATIONS * SQ Lidocaine 60m * Radial Cocktail: 3 mg Verapamil in 10 mL NS * Isovue Contrast: 75 mL * Heparin: 4000 Units  Fluoro time: 9 minutes. Dose Area Product: 17.4 Gycm2. Cumulative Air Kerma: 359.6 mGy.    Estimated blood loss <50 mL.  During this  procedure the patient was administered the following to achieve and maintain moderate conscious sedation: Versed 2 mg, Fentanyl 25 mcg, while the patient's heart rate, blood pressure, and oxygen saturation were continuously monitored. The period of conscious sedation was 39 minutes, of which I was present face-to-face 100% of this time.  Complications   Complications documented before study signed (10/27/2017 3:24 PM EDT)    No complications were associated with this study.  Documented by HLeonie Man MD - 10/27/2017 3:11 PM EDT    Coronary Findings   Diagnostic  Dominance: Right  Left Main  Vessel is large.  Left Anterior Descending  Ost LAD to Mid LAD lesion 85% stenosed with side branch in Ost 2nd Diag to 2nd Diag 99% stenosed  Ost LAD to Mid LAD lesion is 85% stenosed with 99% stenosed side branch in Ost 2nd Diag to 2nd Diag. The lesion is located at the major branch and irregular. The lesion is severely calcified. Heavily calcified initial tapering of the LAD at first diagonal branch from a 4 mm vessel down to a roughly 2 mm vessel when he gives off a heavily calcified first diagonal branch that has diffuse 99% calcified disease. The LAD beyond this branch has several focal 80-90% stenoses in the midst of roughly 70 to 80% stenosis to the mid vessel. There is evidence of a second diagonal branch that fills minimally and is essentially occluded proximally. The LAD itself and wraps around the apex somewhat normalizing in size by the mid vessel. The entire proximal third of the LAD is calcified  First Diagonal Branch  Ost 1st Diag lesion 95% stenosed  Ost 1st Diag lesion is 95% stenosed.  Second Diagonal Branch  Vessel is moderate in size. There is severe diffuse disease in the vessel.  2nd Diag lesion 100% stenosed  2nd Diag lesion is 100% stenosed. The lesion is chronically occluded.  Left Circumflex  Vessel is large.  First Obtuse Marginal Branch  Vessel is small in size.  Third  ODesigner, fashion/clothing  Vessel is moderate in size.  Ost 3rd Mrg lesion 70% stenosed  Ost 3rd Mrg lesion is 70% stenosed. The lesion is focal.  Left Atrioventricular Groove Continuation  Vessel is small in size.  Right Coronary Artery  Vessel is normal in caliber.  Prox RCA lesion 95% stenosed  Prox RCA lesion is 95% stenosed. The lesion is eccentric and irregular.  Prox RCA to Mid RCA lesion 85% stenosed  Prox RCA to Mid RCA lesion is 85% stenosed. The lesion is eccentric and smooth.  Dist RCA lesion 40% stenosed  Dist RCA lesion is 40% stenosed. The lesion is discrete and concentric.  Right Posterior Atrioventricular Branch  Post Atrio lesion 90% stenosed  Post Atrio lesion is 90% stenosed. The lesion is focal and concentric.  Intervention   No interventions have been documented.  Wall Motion   Resting    No LV Gram - to conserve contrast        Left Heart   Left Ventricle LV end diastolic pressure is mildly elevated.  Aortic Valve There is no aortic valve stenosis.  Coronary Diagrams   Diagnostic Diagram       Implants    No implant documentation for this case.  MERGE Images   Show images for CARDIAC CATHETERIZATION   Link to Procedure Log   Procedure Log    Hemo Data    Most Recent Value  AO Systolic Pressure 299 mmHg  AO Diastolic Pressure 64 mmHg  AO Mean 90 mmHg  LV Systolic Pressure 242 mmHg  LV Diastolic Pressure 3 mmHg  LV EDP 10 mmHg  AOp Systolic Pressure 683 mmHg  AOp Diastolic Pressure 61 mmHg  AOp Mean Pressure 97 mmHg  LVp Systolic Pressure 419 mmHg  LVp Diastolic Pressure 19 mmHg  LVp EDP Pressure 46 mmHg    Impression:  This 72 year old gentleman has severe three-vessel coronary disease with markedly calcified and diffusely diseased vessels.  I think his LAD, second marginal, and right coronary artery are graftable.  I doubt that his diagonal branch is large enough to graft and I do not think the posterior lateral branch of the  right coronary artery is large on the graft.  He does have a high-grade proximal LAD stenosis and I think his best long-term prognosis would be with coronary artery bypass graft surgery.  His operative risk is elevated due to his comorbid risk factors of diabetes, ongoing heavy smoking, diffuse vascular disease, and reduced mobility with peripheral neuropathy and frequent falls.  I think he would have a slow postoperative recovery due to to his decreased mobility and frequent falls.  I discussed the operative procedure with the patient and his wife including alternatives, benefits, and risks including but not limited to bleeding, blood transfusion, infection, graft failure, organ dysfunction, and death.  I reviewed the cardiac catheterization images with him and his wife and answered all their questions.  He said that since he is not really having any significant symptoms he would like to continue thinking about it and discuss it further with his family before making a final decision.  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  Plan:  The patient is going to go home and think about coronary bypass graft surgery and discuss  it further with his family before making a final decision.  He will call us to let us know when he decides.  I spent 60 minutes performing this consultation and > 50% of this time was spent face to face counseling and coordinating the care of this patient's severe multivessel coronary disease.   Gaye Pollack, MD [facility] [phone]

## 2017-12-06 ENCOUNTER — Ambulatory Visit: Payer: Medicare Other | Admitting: Cardiology

## 2017-12-07 ENCOUNTER — Ambulatory Visit: Payer: No Typology Code available for payment source | Admitting: Family

## 2017-12-07 ENCOUNTER — Encounter (HOSPITAL_COMMUNITY): Payer: No Typology Code available for payment source

## 2017-12-26 ENCOUNTER — Ambulatory Visit (HOSPITAL_COMMUNITY): Payer: Medicare Other

## 2017-12-26 ENCOUNTER — Ambulatory Visit: Payer: Self-pay | Admitting: Family

## 2018-01-29 ENCOUNTER — Other Ambulatory Visit: Payer: Self-pay

## 2018-01-29 DIAGNOSIS — I779 Disorder of arteries and arterioles, unspecified: Secondary | ICD-10-CM

## 2018-02-06 ENCOUNTER — Encounter: Payer: Self-pay | Admitting: Surgery

## 2018-02-06 ENCOUNTER — Ambulatory Visit (HOSPITAL_COMMUNITY)
Admission: RE | Admit: 2018-02-06 | Discharge: 2018-02-06 | Disposition: A | Discharge status: Home / Self Care | Payer: Medicare Other | Source: Ambulatory Visit | Attending: Vascular Surgery | Admitting: Vascular Surgery

## 2018-02-06 ENCOUNTER — Encounter: Payer: Self-pay | Admitting: Family

## 2018-02-06 ENCOUNTER — Encounter: Payer: Self-pay | Admitting: Vascular Surgery

## 2018-02-06 ENCOUNTER — Ambulatory Visit (INDEPENDENT_AMBULATORY_CARE_PROVIDER_SITE_OTHER): Payer: Medicare Other | Admitting: Family

## 2018-02-06 VITALS — BP 155/88 | HR 89 | Temp 98.0°F | Resp 16 | Ht 72.0 in | Wt 177.0 lb

## 2018-02-06 DIAGNOSIS — F17200 Nicotine dependence, unspecified, uncomplicated: Secondary | ICD-10-CM | POA: Diagnosis not present

## 2018-02-06 DIAGNOSIS — E1151 Type 2 diabetes mellitus with diabetic peripheral angiopathy without gangrene: Secondary | ICD-10-CM | POA: Diagnosis not present

## 2018-02-06 DIAGNOSIS — I779 Disorder of arteries and arterioles, unspecified: Secondary | ICD-10-CM

## 2018-02-06 DIAGNOSIS — E1165 Type 2 diabetes mellitus with hyperglycemia: Secondary | ICD-10-CM | POA: Diagnosis not present

## 2018-02-06 DIAGNOSIS — IMO0002 Reserved for concepts with insufficient information to code with codable children: Secondary | ICD-10-CM

## 2018-02-06 NOTE — Patient Instructions (Signed)
Steps to Quit Smoking Smoking tobacco can be bad for your health. It can also affect almost every organ in your body. Smoking puts you and people around you at risk for many serious long-lasting (chronic) diseases. Quitting smoking is hard, but it is one of the best things that you can do for your health. It is never too late to quit. What are the benefits of quitting smoking? When you quit smoking, you lower your risk for getting serious diseases and conditions. They can include:  Lung cancer or lung disease.  Heart disease.  Stroke.  Heart attack.  Not being able to have children (infertility).  Weak bones (osteoporosis) and broken bones (fractures).  If you have coughing, wheezing, and shortness of breath, those symptoms may get better when you quit. You may also get sick less often. If you are pregnant, quitting smoking can help to lower your chances of having a baby of low birth weight. What can I do to help me quit smoking? Talk with your doctor about what can help you quit smoking. Some things you can do (strategies) include:  Quitting smoking totally, instead of slowly cutting back how much you smoke over a period of time.  Going to in-person counseling. You are more likely to quit if you go to many counseling sessions.  Using resources and support systems, such as: ? Online chats with a counselor. ? Phone quitlines. ? Printed self-help materials. ? Support groups or group counseling. ? Text messaging programs. ? Mobile phone apps or applications.  Taking medicines. Some of these medicines may have nicotine in them. If you are pregnant or breastfeeding, do not take any medicines to quit smoking unless your doctor says it is okay. Talk with your doctor about counseling or other things that can help you.  Talk with your doctor about using more than one strategy at the same time, such as taking medicines while you are also going to in-person counseling. This can help make  quitting easier. What things can I do to make it easier to quit? Quitting smoking might feel very hard at first, but there is a lot that you can do to make it easier. Take these steps:  Talk to your family and friends. Ask them to support and encourage you.  Call phone quitlines, reach out to support groups, or work with a counselor.  Ask people who smoke to not smoke around you.  Avoid places that make you want (trigger) to smoke, such as: ? Bars. ? Parties. ? Smoke-break areas at work.  Spend time with people who do not smoke.  Lower the stress in your life. Stress can make you want to smoke. Try these things to help your stress: ? Getting regular exercise. ? Deep-breathing exercises. ? Yoga. ? Meditating. ? Doing a body scan. To do this, close your eyes, focus on one area of your body at a time from head to toe, and notice which parts of your body are tense. Try to relax the muscles in those areas.  Download or buy apps on your mobile phone or tablet that can help you stick to your quit plan. There are many free apps, such as QuitGuide from the CDC (Centers for Disease Control and Prevention). You can find more support from smokefree.gov and other websites.  This information is not intended to replace advice given to you by your health care provider. Make sure you discuss any questions you have with your health care provider. Document Released: 12/18/2008 Document   Revised: 10/20/2015 Document Reviewed: 07/08/2014 Elsevier Interactive Patient Education  2018 Elsevier Inc.     Peripheral Vascular Disease Peripheral vascular disease (PVD) is a disease of the blood vessels that are not part of your heart and brain. A simple term for PVD is poor circulation. In most cases, PVD narrows the blood vessels that carry blood from your heart to the rest of your body. This can result in a decreased supply of blood to your arms, legs, and internal organs, like your stomach or kidneys.  However, it most often affects a person's lower legs and feet. There are two types of PVD.  Organic PVD. This is the more common type. It is caused by damage to the structure of blood vessels.  Functional PVD. This is caused by conditions that make blood vessels contract and tighten (spasm).  Without treatment, PVD tends to get worse over time. PVD can also lead to acute ischemic limb. This is when an arm or limb suddenly has trouble getting enough blood. This is a medical emergency. Follow these instructions at home:  Take medicines only as told by your doctor.  Do not use any tobacco products, including cigarettes, chewing tobacco, or electronic cigarettes. If you need help quitting, ask your doctor.  Lose weight if you are overweight, and maintain a healthy weight as told by your doctor.  Eat a diet that is low in fat and cholesterol. If you need help, ask your doctor.  Exercise regularly. Ask your doctor for some good activities for you.  Take good care of your feet. ? Wear comfortable shoes that fit well. ? Check your feet often for any cuts or sores. Contact a doctor if:  You have cramps in your legs while walking.  You have leg pain when you are at rest.  You have coldness in a leg or foot.  Your skin changes.  You are unable to get or have an erection (erectile dysfunction).  You have cuts or sores on your feet that are not healing. Get help right away if:  Your arm or leg turns cold and blue.  Your arms or legs become red, warm, swollen, painful, or numb.  You have chest pain or trouble breathing.  You suddenly have weakness in your face, arm, or leg.  You become very confused or you cannot speak.  You suddenly have a very bad headache.  You suddenly cannot see. This information is not intended to replace advice given to you by your health care provider. Make sure you discuss any questions you have with your health care provider. Document Released:  05/18/2009 Document Revised: 07/30/2015 Document Reviewed: 08/01/2013 Elsevier Interactive Patient Education  2017 Elsevier Inc.  

## 2018-02-06 NOTE — Progress Notes (Signed)
[facility] [place]   CC: Follow up peripheral artery occlusive disease  History of Present Illness Brian Pratt is a 72 y.o. male s/p angioplasty of right external iliac artery, bilateral femoral artery endarterectomy, and right to left fem-fem bypass graft on 02-05-16 by Dr. Donnetta Hutching.  He has been able to walk better. He does have multiple ongoing complaints. 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 Also has severe degenerative disc disease. 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 He does have claudication mostly in his calves. This does make it difficult for him to do routine activities that time but had no tissue loss. His DM neuropathy is very bothersome to him.   He has fallen 17 times since March 07, 2017.   Dr. Donnetta Hutching last evaluated pt on 05-24-16. At that timerightankle arm index of 0.68 and 0.53 on the left with monophasic waveforms Stable overall. Did undergo extremely extensive iliac and femoral endarterectomies bilaterally with a right to left femorofemoral bypass and also stenting of his inflow external iliac artery. Fortunately he has maintained patency of this and has tolerable claudication. He was tocontinue his walking program and will see [place] again in 6 months. He was tonotify usshould he develop any worsening symptoms.  He had a heart cath earlier in 2019.  He denies any known hx of stroke, but might have had a TIA May 2018 as manifested by transient confusion. He states ultrasound of his neck arteries was done at [facility] that time; no carotid duplex results on file here.   He states his PTSD has returned and he is under tx for this.   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 Nila Nephew is his urologist in [place].   He states he walks 1/2 mile in his daily routine. He will be getting a power w/c.   He volunteers with [employer].   Diabetic:Yes, last A1C result on file was 9.4 on 02-24-17, he states his endocrinologist is Dr. Chana Bode in the [facility] Tobacco FXO:VANVBT (1 1/3ppd, started at age 27yrs)  Pt meds include: Statin :Yes Betablocker:No ASA:No, pt states years ago he had anemia and blood was found in his stool, states ASA was stopped at that time  Other anticoagulants/antiplatelets:Eliquis was started when he was diagnosed with atrial flutter in January 2019, but found that he became more anemic, and this was stopped, no GI source of bleed found      Past Medical History:  Diagnosis Date  . Arthritis   . Atrial flutter ([place])   . Complication of anesthesia    slow to awaken to awaken and confusion after fem /pop bypass 02-05-2016  . Concussion    age 10  . Depression    history of years ago about 20 years ago  . Diabetes mellitus without complication ([place])    type 2  . GERD (gastroesophageal reflux disease)   . Hyperlipidemia   . Hypertension   . Neuromuscular disorder ([facility])    neuropathy  . PAD (peripheral artery disease) ([place]) 02/2016   R->L fem-fem bpg  . PTSD (post-traumatic stress disorder)     Social History Social History   Tobacco Use  . Smoking status: Current Every Day Smoker    Packs/day: 1.50    Years: 40.00    Pack years: 60.00    Types: Cigarettes  . Smokeless tobacco: Never Used  Substance Use Topics  . Alcohol use: No  . Drug use: No    Family History Family History  Problem Relation Age of Onset  . Diabetes Mother   . Heart failure Mother   . Stroke Father     Past Surgical History:  Procedure Laterality Date  . ANGIOPLASTY ILLIAC ARTERY  02/05/2016   Procedure: Right Balloon ANGIOPLASTY External  ILIAC ARTERY;  Surgeon: Rosetta Posner, MD;  Location: [place];  Service: Vascular;;  . APPENDECTOMY  yrs ago   ruptured  . ENDARTERECTOMY FEMORAL Right 02/05/2016   Procedure: Bilateral ENDARTERECTOMY FEMORAL;  Surgeon: Rosetta Posner,  MD;  Location: [place];  Service: Vascular;  Laterality: Right;  . EYE SURGERY Bilateral    cataracts removed  . FEMORAL-FEMORAL BYPASS GRAFT Bilateral 02/05/2016   Procedure: BYPASS GRAFT RIGHT FEMORAL-LEFT FEMORAL ARTERY;  Surgeon: Rosetta Posner, MD;  Location: [place];  Service: Vascular;  Laterality: Bilateral;  . INTRAOPERATIVE ARTERIOGRAM  02/05/2016   Procedure: INTRA OPERATIVE ARTERIOGRAM;  Surgeon: Rosetta Posner, MD;  Location: [place];  Service: Vascular;;  . IR RADIOLOGIST EVAL & MGMT  12/20/2016  . LEFT HEART CATH AND CORONARY ANGIOGRAPHY N/A 10/27/2017   Procedure: LEFT HEART CATH AND CORONARY ANGIOGRAPHY;  Surgeon: Leonie Man, MD;  Location: [place] [facility];  Service: Cardiovascular;  Laterality: N/A;  . PERIPHERAL VASCULAR CATHETERIZATION N/A 11/16/2015   Procedure: Abdominal Aortogram w/Lower Extremity;  Surgeon: Angelia Mould, MD;  Location: [place] [facility];  Service: Cardiovascular;  Laterality: N/A;  . REPLACEMENT TOTAL KNEE Left 2001    No Known Allergies  Current Outpatient Medications  Medication Sig Dispense Refill  . albuterol-ipratropium (COMBIVENT) 18-103 MCG/ACT inhaler Inhale 2 puffs into the lungs 2 (two) times daily.    . Ascorbic Acid (VITAMIN C) 1000 MG tablet Take 1,000 mg by mouth daily.    . Aspirin-Acetaminophen-Caffeine (EXCEDRIN PO) Take 1 tablet by mouth daily as needed (PAIN).     Marland Kitchen atorvastatin (LIPITOR) 80 MG tablet Take 80 mg by mouth at bedtime.    . Continuous Blood Gluc Sensor (FREESTYLE LIBRE 14 DAY SENSOR) MISC Inject into the skin.    Marland Kitchen gabapentin (NEURONTIN) 300 MG capsule Take 900 mg by mouth every 4 (four) hours.     Marland Kitchen glimepiride (AMARYL) 4 MG tablet Take 4 mg by mouth daily.     . insulin aspart protamine- aspart (NOVOLOG MIX 70/30) (70-30) 100 UNIT/ML injection Inject 50 Units into the skin 2 (two) times daily.     Marland Kitchen lisinopril-hydrochlorothiazide (PRINZIDE,ZESTORETIC) 20-25 MG tablet Take 1 tablet by mouth daily.    .  metFORMIN (GLUCOPHAGE) 1000 MG tablet Take 1,000 mg by mouth 2 (two) times daily with a meal.    . omeprazole (PRILOSEC) 20 MG capsule Take 20 mg by mouth daily.    . polyvinyl alcohol (LIQUIFILM TEARS) 1.4 % ophthalmic solution Place 1 drop into both eyes as needed for dry eyes.     Marland Kitchen rOPINIRole (REQUIP) 0.25 MG tablet Take 0.25 mg by mouth at bedtime.     . sildenafil (VIAGRA) 100 MG tablet Take 50 mg by mouth daily as needed for erectile dysfunction.      No current facility-administered medications for this visit.     ROS: See HPI for pertinent positives and negatives.   Physical Examination  Vitals:   02/06/18 1421  BP: (!) 155/88  Pulse: 89  Resp: 16  Temp: 98 F (36.7 C)  SpO2: 98%  Weight: 177 lb (80.3 kg)  Height: 6' (1.829 m)   Body mass index is 24.01 kg/m.  General: A&O x 3, WDWN,male. Gait:limp, antalgic, using cane HENT: No gross abnormalities  Eyes: PERRLA. Pulmonary: Respirations are non labored, CTAB,slightly diminishedair movement in all fields Cardiac:regularrhythm and rate,+murmur.    Carotid Bruits Right Left   Negative Positive   Radial pulses are1+palpable bilaterally  Adominal aortic pulse isnotpalpable  Femorofemoral graft pulseis palpable  VASCULAR EXAM: Extremitieswithoutischemic changes, Gangrene. Healed wound on left medial malleolus.  LE Pulses Right Left  FEMORAL 2+palpable 2+palpable   POPLITEAL notpalpable  notpalpable  POSTERIOR TIBIAL notpalpable  notpalpable   DORSALIS PEDIS ANTERIOR TIBIAL notpalpable  notpalpable    Abdomen: soft, NT,asymptomatic ventral hernia only present in the process of sitting up or lying down. Musculoskeletal: no muscle wasting or atrophy. Neurologic: A&O X 3; Appropriate Affect, sensation is severely diminished in his feet; MOTOR FUNCTION: moving all extremities equally, motor strength  5/5 throughout. Speech is fluent/normal. CN 2-12 intact. Skin: no rashes, no cellulitis, no ulcers noted. Psychiatric: Thought content is normal, mood appropriate for clinical situation.     ASSESSMENT: Brian Pratt is a 72 y.o. male who iss/p angioplasty of right external iliac artery, bilateral femoral artery endarterectomy, and right to left fem-fem bypass graft on 02-05-16.  His walking is limited bybilateral lower extremity neuropathy related to diabetes with numbness from his midcalf distally bilaterally. Also has severe degenerative disc disease. He does have claudication mostly in his calves. This does make it difficult for him to do routine activities that time but had no tissue loss.  His atherosclerotic risk factors include uncontrolled DM and 52+ years hx of smoking, currently at 1 1/3 ppd., trying to decrease.   DATA  ABI (Date: 02/07/2018):  R:   ABI: 0.77 (was 0.63 on 05-30-17),   PT: bi (was mono)  DP: mono  TBI: [place], toe pressure [place], (was 0.28)   L:   ABI: 0.56 (was 0.51),   PT: bi (was mono)  DP: dampened mono  TBI: [place], toe pressure [place], (was 0.22) Improvement in quality of bilateral PT waveforms,  bi and monophasic waveforms bilaterally. [place] bilateral TBI.    PLAN:  The patient was counseled re smoking cessation and given several free resources re smoking cessation.  Based on the patient's vascular studies and examination, pt will return to clinic in1 yearwith ABI's; I advised him to notify us if he develops concerns re the circulation in his feet or legs.   I discussed in depth with the patient the nature of atherosclerosis, and emphasized the importance of maximal medical management including strict control of blood pressure, blood glucose, and lipid levels, obtaining regular exercise, and cessation of smoking.  The patient is aware that without maximal medical management the underlying atherosclerotic disease process will progress, limiting the  benefit of any interventions.  The patient was given information about PAD including signs, symptoms, treatment, what symptoms should prompt the patient to seek immediate medical care, and risk reduction measures to take.  Clemon Chambers, RN, MSN, FNP-C [facility] Phone: [phone]  Clinic MD: Bishop Dublin  02/07/18 6:54 PM

## 2018-02-07 ENCOUNTER — Encounter: Payer: Self-pay | Admitting: Family

## 2018-05-06 DEATH — deceased

## 2018-07-14 IMAGING — CR DG CHEST 1V
1 series · 1 of 1 positions shown · non-contrast
Comparison: 10/15/2016

CLINICAL DATA: Abdominal pain, renal mass, preop for ablation
history of diabetes and hypertension

EXAM:
CHEST 1 VIEW

[w chest pa]
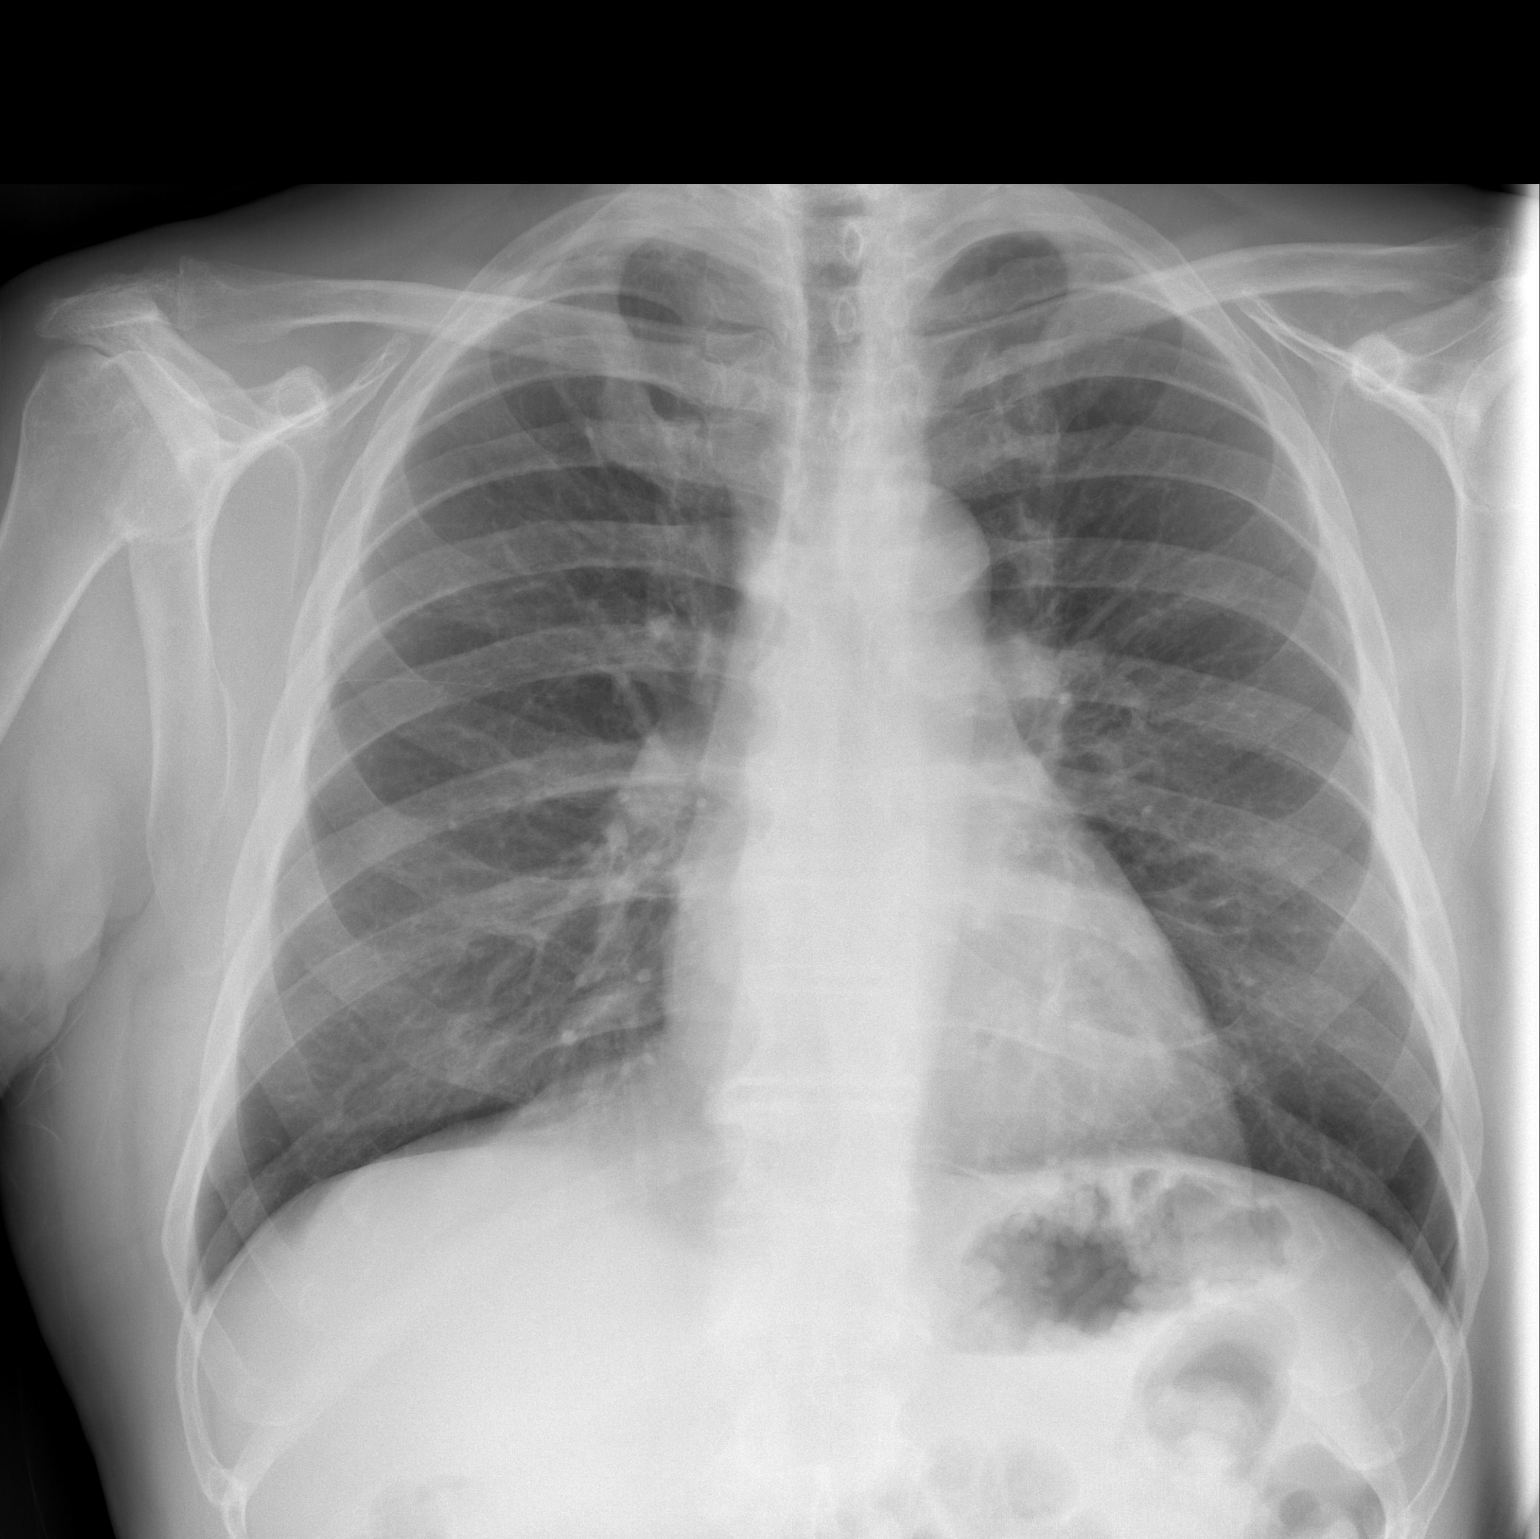

[1 of 1 positions shown; findings below may reference images not displayed]

FINDINGS: The heart size and mediastinal contours are within normal limits.
Atherosclerotic calcification of the aorta. Degenerative change of
the right AC joint.
IMPRESSION: No active disease.
